# Patient Record
Sex: Female | Born: 1945 | Race: White | Hispanic: No | Marital: Married | Smoking: Never smoker
Health system: Southern US, Community
[De-identification: ages and names within clinical notes are randomized; demographics above are authoritative.]

## PROBLEM LIST (undated history)

## (undated) DIAGNOSIS — C801 Malignant (primary) neoplasm, unspecified: Secondary | ICD-10-CM

## (undated) DIAGNOSIS — E785 Hyperlipidemia, unspecified: Secondary | ICD-10-CM

## (undated) DIAGNOSIS — I10 Essential (primary) hypertension: Secondary | ICD-10-CM

## (undated) DIAGNOSIS — M8580 Other specified disorders of bone density and structure, unspecified site: Secondary | ICD-10-CM

## (undated) DIAGNOSIS — F419 Anxiety disorder, unspecified: Secondary | ICD-10-CM

## (undated) HISTORY — DX: Malignant (primary) neoplasm, unspecified: C80.1

## (undated) HISTORY — DX: Anxiety disorder, unspecified: F41.9

## (undated) HISTORY — DX: Hyperlipidemia, unspecified: E78.5

## (undated) HISTORY — PX: OTHER SURGICAL HISTORY: SHX169

## (undated) HISTORY — DX: Other specified disorders of bone density and structure, unspecified site: M85.80

## (undated) HISTORY — DX: Essential (primary) hypertension: I10

## (undated) HISTORY — PX: OVARIAN CYST REMOVAL: SHX89

---

## 1998-08-25 ENCOUNTER — Ambulatory Visit (HOSPITAL_COMMUNITY): Admission: RE | Admit: 1998-08-25 | Discharge: 1998-08-25 | Payer: Self-pay | Admitting: Gastroenterology

## 1999-02-07 ENCOUNTER — Ambulatory Visit (HOSPITAL_COMMUNITY): Admission: RE | Admit: 1999-02-07 | Discharge: 1999-02-07 | Payer: Self-pay

## 1999-10-16 ENCOUNTER — Other Ambulatory Visit: Admission: RE | Admit: 1999-10-16 | Discharge: 1999-10-16 | Payer: Self-pay | Admitting: Gynecology

## 2000-02-08 ENCOUNTER — Encounter: Admission: RE | Admit: 2000-02-08 | Discharge: 2000-02-08 | Payer: Self-pay | Admitting: Gynecology

## 2000-02-08 ENCOUNTER — Encounter: Payer: Self-pay | Admitting: Gynecology

## 2001-02-06 ENCOUNTER — Other Ambulatory Visit: Admission: RE | Admit: 2001-02-06 | Discharge: 2001-02-06 | Payer: Self-pay | Admitting: Gynecology

## 2001-03-23 ENCOUNTER — Encounter: Payer: Self-pay | Admitting: Gynecology

## 2001-03-23 ENCOUNTER — Encounter: Admission: RE | Admit: 2001-03-23 | Discharge: 2001-03-23 | Payer: Self-pay | Admitting: Gynecology

## 2002-02-17 ENCOUNTER — Other Ambulatory Visit: Admission: RE | Admit: 2002-02-17 | Discharge: 2002-02-17 | Payer: Self-pay | Admitting: Gynecology

## 2002-03-02 ENCOUNTER — Encounter: Admission: RE | Admit: 2002-03-02 | Discharge: 2002-03-02 | Payer: Self-pay | Admitting: Gynecology

## 2002-03-02 ENCOUNTER — Encounter: Payer: Self-pay | Admitting: Gynecology

## 2002-09-13 ENCOUNTER — Encounter: Payer: Self-pay | Admitting: Gynecology

## 2002-09-13 ENCOUNTER — Encounter: Admission: RE | Admit: 2002-09-13 | Discharge: 2002-09-13 | Payer: Self-pay | Admitting: Gynecology

## 2003-07-19 ENCOUNTER — Other Ambulatory Visit: Admission: RE | Admit: 2003-07-19 | Discharge: 2003-07-19 | Payer: Self-pay | Admitting: Gynecology

## 2003-09-13 ENCOUNTER — Emergency Department (HOSPITAL_COMMUNITY): Admission: EM | Admit: 2003-09-13 | Discharge: 2003-09-14 | Payer: Self-pay | Admitting: Emergency Medicine

## 2003-09-14 ENCOUNTER — Ambulatory Visit (HOSPITAL_COMMUNITY): Admission: RE | Admit: 2003-09-14 | Discharge: 2003-09-14 | Payer: Self-pay | Admitting: Gastroenterology

## 2004-06-21 ENCOUNTER — Encounter: Admission: RE | Admit: 2004-06-21 | Discharge: 2004-06-21 | Payer: Self-pay | Admitting: Gynecology

## 2004-07-25 ENCOUNTER — Other Ambulatory Visit: Admission: RE | Admit: 2004-07-25 | Discharge: 2004-07-25 | Payer: Self-pay | Admitting: Gynecology

## 2005-09-25 ENCOUNTER — Encounter: Admission: RE | Admit: 2005-09-25 | Discharge: 2005-09-25 | Payer: Self-pay | Admitting: Gynecology

## 2006-03-31 ENCOUNTER — Other Ambulatory Visit: Admission: RE | Admit: 2006-03-31 | Discharge: 2006-03-31 | Payer: Self-pay | Admitting: Gynecology

## 2011-11-08 DIAGNOSIS — R5381 Other malaise: Secondary | ICD-10-CM | POA: Diagnosis not present

## 2011-11-08 DIAGNOSIS — E7800 Pure hypercholesterolemia, unspecified: Secondary | ICD-10-CM | POA: Diagnosis not present

## 2011-11-08 DIAGNOSIS — E039 Hypothyroidism, unspecified: Secondary | ICD-10-CM | POA: Diagnosis not present

## 2011-11-08 DIAGNOSIS — E559 Vitamin D deficiency, unspecified: Secondary | ICD-10-CM | POA: Diagnosis not present

## 2011-11-13 DIAGNOSIS — H2510 Age-related nuclear cataract, unspecified eye: Secondary | ICD-10-CM | POA: Diagnosis not present

## 2011-11-20 DIAGNOSIS — L821 Other seborrheic keratosis: Secondary | ICD-10-CM | POA: Diagnosis not present

## 2011-11-20 DIAGNOSIS — K130 Diseases of lips: Secondary | ICD-10-CM | POA: Diagnosis not present

## 2011-11-20 DIAGNOSIS — L570 Actinic keratosis: Secondary | ICD-10-CM | POA: Diagnosis not present

## 2011-12-31 DIAGNOSIS — N8184 Pelvic muscle wasting: Secondary | ICD-10-CM | POA: Diagnosis not present

## 2011-12-31 DIAGNOSIS — E7800 Pure hypercholesterolemia, unspecified: Secondary | ICD-10-CM | POA: Diagnosis not present

## 2011-12-31 DIAGNOSIS — I10 Essential (primary) hypertension: Secondary | ICD-10-CM | POA: Diagnosis not present

## 2011-12-31 DIAGNOSIS — Z01419 Encounter for gynecological examination (general) (routine) without abnormal findings: Secondary | ICD-10-CM | POA: Diagnosis not present

## 2012-01-20 DIAGNOSIS — A630 Anogenital (venereal) warts: Secondary | ICD-10-CM | POA: Diagnosis not present

## 2012-01-20 DIAGNOSIS — D4959 Neoplasm of unspecified behavior of other genitourinary organ: Secondary | ICD-10-CM | POA: Diagnosis not present

## 2012-04-23 DIAGNOSIS — G4700 Insomnia, unspecified: Secondary | ICD-10-CM | POA: Diagnosis not present

## 2012-04-23 DIAGNOSIS — J019 Acute sinusitis, unspecified: Secondary | ICD-10-CM | POA: Diagnosis not present

## 2012-04-23 DIAGNOSIS — J209 Acute bronchitis, unspecified: Secondary | ICD-10-CM | POA: Diagnosis not present

## 2012-04-29 DIAGNOSIS — J410 Simple chronic bronchitis: Secondary | ICD-10-CM | POA: Diagnosis not present

## 2012-12-08 DIAGNOSIS — E039 Hypothyroidism, unspecified: Secondary | ICD-10-CM | POA: Diagnosis not present

## 2012-12-08 DIAGNOSIS — I10 Essential (primary) hypertension: Secondary | ICD-10-CM | POA: Diagnosis not present

## 2012-12-08 DIAGNOSIS — F411 Generalized anxiety disorder: Secondary | ICD-10-CM | POA: Diagnosis not present

## 2012-12-08 DIAGNOSIS — E782 Mixed hyperlipidemia: Secondary | ICD-10-CM | POA: Diagnosis not present

## 2012-12-08 DIAGNOSIS — F329 Major depressive disorder, single episode, unspecified: Secondary | ICD-10-CM | POA: Diagnosis not present

## 2012-12-08 DIAGNOSIS — Z79899 Other long term (current) drug therapy: Secondary | ICD-10-CM | POA: Diagnosis not present

## 2012-12-08 DIAGNOSIS — E559 Vitamin D deficiency, unspecified: Secondary | ICD-10-CM | POA: Diagnosis not present

## 2012-12-08 DIAGNOSIS — E119 Type 2 diabetes mellitus without complications: Secondary | ICD-10-CM | POA: Diagnosis not present

## 2012-12-09 DIAGNOSIS — H2510 Age-related nuclear cataract, unspecified eye: Secondary | ICD-10-CM | POA: Diagnosis not present

## 2013-05-11 DIAGNOSIS — G4700 Insomnia, unspecified: Secondary | ICD-10-CM | POA: Diagnosis not present

## 2013-05-11 DIAGNOSIS — F411 Generalized anxiety disorder: Secondary | ICD-10-CM | POA: Diagnosis not present

## 2013-05-11 DIAGNOSIS — I10 Essential (primary) hypertension: Secondary | ICD-10-CM | POA: Diagnosis not present

## 2013-05-11 DIAGNOSIS — F329 Major depressive disorder, single episode, unspecified: Secondary | ICD-10-CM | POA: Diagnosis not present

## 2013-07-09 DIAGNOSIS — Z85828 Personal history of other malignant neoplasm of skin: Secondary | ICD-10-CM | POA: Diagnosis not present

## 2013-07-09 DIAGNOSIS — L570 Actinic keratosis: Secondary | ICD-10-CM | POA: Diagnosis not present

## 2013-07-09 DIAGNOSIS — D485 Neoplasm of uncertain behavior of skin: Secondary | ICD-10-CM | POA: Diagnosis not present

## 2013-07-09 DIAGNOSIS — L820 Inflamed seborrheic keratosis: Secondary | ICD-10-CM | POA: Diagnosis not present

## 2013-07-09 DIAGNOSIS — L578 Other skin changes due to chronic exposure to nonionizing radiation: Secondary | ICD-10-CM | POA: Diagnosis not present

## 2013-08-11 DIAGNOSIS — H6120 Impacted cerumen, unspecified ear: Secondary | ICD-10-CM | POA: Diagnosis not present

## 2013-08-18 ENCOUNTER — Encounter: Payer: Self-pay | Admitting: Gynecology

## 2013-08-18 ENCOUNTER — Telehealth: Payer: Self-pay | Admitting: Gynecology

## 2013-08-18 ENCOUNTER — Ambulatory Visit (INDEPENDENT_AMBULATORY_CARE_PROVIDER_SITE_OTHER): Payer: Medicare Other | Admitting: Gynecology

## 2013-08-18 ENCOUNTER — Other Ambulatory Visit (HOSPITAL_COMMUNITY)
Admission: RE | Admit: 2013-08-18 | Discharge: 2013-08-18 | Disposition: A | Discharge status: Home / Self Care | Payer: Medicare Other | Source: Ambulatory Visit | Attending: Gynecology | Admitting: Gynecology

## 2013-08-18 VITALS — BP 124/78 | Ht 67.0 in | Wt 157.0 lb

## 2013-08-18 DIAGNOSIS — I10 Essential (primary) hypertension: Secondary | ICD-10-CM

## 2013-08-18 DIAGNOSIS — N952 Postmenopausal atrophic vaginitis: Secondary | ICD-10-CM

## 2013-08-18 DIAGNOSIS — Z124 Encounter for screening for malignant neoplasm of cervix: Secondary | ICD-10-CM

## 2013-08-18 DIAGNOSIS — F411 Generalized anxiety disorder: Secondary | ICD-10-CM | POA: Diagnosis not present

## 2013-08-18 MED ORDER — METOPROLOL SUCCINATE ER 25 MG PO TB24: 25.0000 mg | ORAL_TABLET | Freq: Every day | ORAL | Status: DC

## 2013-08-18 MED ORDER — ALPRAZOLAM 0.5 MG PO TABS: 0.5000 mg | ORAL_TABLET | Freq: Two times a day (BID) | ORAL | Status: DC | PRN

## 2013-08-18 NOTE — Telephone Encounter (Signed)
Tell patient her last bone density did show osteopenia and I recommend a repeat bone density scheduled here at her convenience.

## 2013-08-18 NOTE — Progress Notes (Signed)
Cheryl Myers 29-Jan-1946 [id number]        68 y.o.  G2P2002 new patient  for  followup  exam.  Former patient of Dr. Ubaldo Glassing. Several issues noted below.  Past medical history,surgical history, problem list, medications, allergies, family history and social history were all reviewed and documented in the EPIC chart.  ROS:  Performed and pertinent positives and negatives are included in the history, assessment and plan .  Exam: [name]  assistant Filed Vitals:   08/18/13 1102  BP: 124/78  Height: 5\' 7"  (1.702 m)  Weight: 157 lb (71.215 kg)   General appearance  Normal Skin grossly normal Head/Neck normal with no cervical or supraclavicular adenopathy thyroid normal Lungs  clear Cardiac RR, without RMG Abdominal  soft, nontender, without masses, organomegaly or hernia Breasts  examined lying and sitting without masses, retractions, discharge or axillary adenopathy. Pelvic  Ext/BUS/vagina with generalized atrophic changes  Cervix with atrophic changes. Pap done  Uterus  anteverted,  normal size, shape and contour, midline and mobile nontender   Adnexa  Without masses or tenderness    Anus and perineum  Normal   Rectovaginal  Normal sphincter tone without palpated masses or tenderness.    Assessment/Plan:  68 y.o. [id number] new patient.   1. Postmenopausal/atrophic genital changes. Patient without significant symptoms of hot flushes, night sweats, vaginal dryness or dyspareunia. No vaginal bleeding. Will continue to monitor. Patient knows to report any vaginal bleeding. 2. Anxiety. Patient has a history of anxiety for a number of years. Started on Zoloft by Dr. Ubaldo Glassing and Xanax when necessary. Went through death of her mother with Alzheimer's and issues with her children. She admits to not taking consistently. Recommended that she stop her Zoloft and to use Xanax when necessary as she relates using it only when she feels anxious. Xanax 0.5 mg #30 with 4 refills provided. Patient will call me  she has an issue with not continuing the Zoloft. Again she has been off of this for some time and does not need to taper at this point. 3. Hypertension. Blood pressure 124/78. Being treated by Dr. Ubaldo Glassing with metoprolol for a number of years. Recommended that patient establish care with a primary physician/internist to continue to follow her hypertension and for her general medical health given that she is 40. Patient agrees to establish care either here or [place]. I refilled her metoprolol tartrate 2 months to allow her time to get in to see somebody. 4. Bone density reported normal 2012. We'll obtain a copy of this and then triaged based upon these results. Extra calcium and vitamin D recommendations reviewed. 5. Pap smear 2013. I have no records of her previous Pap smears but she denies any abnormalities in the past. Pap smear done today. 6. Mammography 2013. Patient knows that she needs to schedule now and annually. SBE monthly reviewed. 7. Colonoscopy 8 years ago. Recommended repeat interval per patient's history is 10 years. 8. Health maintenance. No blood work done and she will have this done when she establishes care for her primary physician's office. Followup one year, sooner as needed.   Note: This document was prepared with digital dictation and possible smart phrase technology. Any transcriptional errors that result from this process are unintentional.   Anastasio Auerbach MD, 11:48 AM 08/18/2013

## 2013-08-18 NOTE — Patient Instructions (Signed)
Make an appointment to see a primary care physician in followup of your hypertension and to be able to do routine blood work. Followup with me in one year for ongoing gynecologic care.

## 2013-08-18 NOTE — Addendum Note (Signed)
Addended by: Nelva Nay on: 08/18/2013 12:03 PM   Modules accepted: Orders

## 2013-08-19 LAB — URINALYSIS W MICROSCOPIC + REFLEX CULTURE
BACTERIA UA: NONE SEEN
BILIRUBIN URINE: NEGATIVE
CASTS: NONE SEEN
Crystals: NONE SEEN
Glucose, UA: NEGATIVE mg/dL
HGB URINE DIPSTICK: NEGATIVE
KETONES UR: NEGATIVE mg/dL
LEUKOCYTES UA: NEGATIVE
NITRITE: NEGATIVE
PH: 5 (ref 5.0–8.0)
PROTEIN: NEGATIVE mg/dL
Specific Gravity, Urine: <1.005 — ABNORMAL LOW (ref 1.005–1.030)
Squamous Epithelial / LPF: NONE SEEN
Urobilinogen, UA: 0.2 mg/dL (ref 0.0–1.0)

## 2013-08-19 NOTE — Telephone Encounter (Signed)
Talked with patient and informed her of Dr Zelphia Cairo note and scheduled Bone Density for October 05, 2013.

## 2013-08-25 ENCOUNTER — Other Ambulatory Visit: Payer: Self-pay | Admitting: Gynecology

## 2013-08-25 DIAGNOSIS — M8580 Other specified disorders of bone density and structure, unspecified site: Secondary | ICD-10-CM

## 2013-09-24 DIAGNOSIS — D045 Carcinoma in situ of skin of trunk: Secondary | ICD-10-CM | POA: Diagnosis not present

## 2013-11-10 ENCOUNTER — Encounter: Payer: Self-pay | Admitting: Gynecology

## 2013-11-10 ENCOUNTER — Ambulatory Visit (INDEPENDENT_AMBULATORY_CARE_PROVIDER_SITE_OTHER): Payer: Medicare Other | Admitting: Gynecology

## 2013-11-10 ENCOUNTER — Other Ambulatory Visit: Payer: Self-pay | Admitting: Gynecology

## 2013-11-10 DIAGNOSIS — F411 Generalized anxiety disorder: Secondary | ICD-10-CM | POA: Diagnosis not present

## 2013-11-10 DIAGNOSIS — R7989 Other specified abnormal findings of blood chemistry: Secondary | ICD-10-CM | POA: Diagnosis not present

## 2013-11-10 DIAGNOSIS — R82998 Other abnormal findings in urine: Secondary | ICD-10-CM | POA: Diagnosis not present

## 2013-11-10 MED ORDER — PAROXETINE HCL 20 MG PO TABS: 20.0000 mg | ORAL_TABLET | Freq: Every day | ORAL | Status: DC

## 2013-11-10 NOTE — Patient Instructions (Signed)
Start on Paxil 20 mg daily. Call me in one month in followup to see how you're doing. If you seem to be having worsening anxiety, depression or suicide thoughts you need to call as soon as possible.

## 2013-11-10 NOTE — Progress Notes (Signed)
Cheryl Myers 05-24-1946 [id number]        68 y.o.  G2P2002 presents complaining of worsening anxiety, crying spells and difficulty sleeping. Had been on Zoloft 100 mg and Xanax when necessary when I saw her in February. She is weaned off the Zoloft and her symptoms seem to be worsening. She is not having any suicide ideation or difficulty functioning on a daily basis. She is under a lot of stress caring for her daughter who has MS.  Past medical history,surgical history, problem list, medications, allergies, family history and social history were all reviewed and documented in the EPIC chart.  Directed ROS with pertinent positives and negatives documented in the history of present illness/assessment and plan.  Exam: Pulse regular at 72, respirations 14 General appearance  Normal without overt evidence of anxiety.  Assessment/Plan:  68 y.o. G2P2002 worsening anxiety since stopping Zoloft. She does note having withdrawal symptoms to include nausea and GI upset following her weaning. She finds herself taking Xanax #30 each month although not daily sometimes she takes 2 a day. Not having overt anxiety attacks or worsening depression. Discussed very his options and ultimately we decided on a trial of Paxil 20 mg daily. She is to call me in one month after initiation and followup and will see how she's doing and decide if a dose adjustments necessary. Worsening depression or suicide ideation ASAP call precautions reviewed. If her symptoms continue or worsen then will refer her to a mental health provider.   Note: This document was prepared with digital dictation and possible smart phrase technology. Any transcriptional errors that result from this process are unintentional.   Anastasio Auerbach MD, 3:17 PM 11/10/2013

## 2013-11-11 ENCOUNTER — Other Ambulatory Visit: Payer: Self-pay | Admitting: *Deleted

## 2013-11-11 ENCOUNTER — Encounter: Payer: Self-pay | Admitting: Gynecology

## 2013-11-11 DIAGNOSIS — R748 Abnormal levels of other serum enzymes: Secondary | ICD-10-CM

## 2013-11-11 DIAGNOSIS — R7309 Other abnormal glucose: Secondary | ICD-10-CM

## 2013-11-11 LAB — CBC WITH DIFFERENTIAL/PLATELET
BASOS PCT: 0 % (ref 0–1)
Basophils Absolute: 0 10*3/uL (ref 0.0–0.1)
EOS ABS: 0.2 10*3/uL (ref 0.0–0.7)
EOS PCT: 3 % (ref 0–5)
HEMATOCRIT: 35.8 % — AB (ref 36.0–46.0)
HEMOGLOBIN: 12.1 g/dL (ref 12.0–15.0)
LYMPHS ABS: 1.9 10*3/uL (ref 0.7–4.0)
LYMPHS PCT: 37 % (ref 12–46)
MCH: 30.3 pg (ref 26.0–34.0)
MCHC: 33.8 g/dL (ref 30.0–36.0)
MCV: 89.7 fL (ref 78.0–100.0)
MONO ABS: 0.4 10*3/uL (ref 0.1–1.0)
MONOS PCT: 8 % (ref 3–12)
NEUTROS ABS: 2.7 10*3/uL (ref 1.7–7.7)
Neutrophils Relative %: 52 % (ref 43–77)
Platelets: 251 10*3/uL (ref 150–400)
RBC: 3.99 MIL/uL (ref 3.87–5.11)
RDW: 13.2 % (ref 11.5–15.5)
WBC: 5.1 10*3/uL (ref 4.0–10.5)

## 2013-11-11 LAB — COMPREHENSIVE METABOLIC PANEL
ALK PHOS: 74 U/L (ref 39–117)
ALT: 17 U/L (ref 0–35)
AST: 15 U/L (ref 0–37)
Albumin: 4.4 g/dL (ref 3.5–5.2)
BUN: 16 mg/dL (ref 6–23)
CHLORIDE: 103 meq/L (ref 96–112)
CO2: 26 meq/L (ref 19–32)
CREATININE: 1.22 mg/dL — AB (ref 0.50–1.10)
Calcium: 9.9 mg/dL (ref 8.4–10.5)
GLUCOSE: 103 mg/dL — AB (ref 70–99)
Potassium: 3.9 mEq/L (ref 3.5–5.3)
SODIUM: 140 meq/L (ref 135–145)
TOTAL PROTEIN: 6.8 g/dL (ref 6.0–8.3)
Total Bilirubin: 0.4 mg/dL (ref 0.2–1.2)

## 2013-11-11 LAB — TSH: TSH: 2.138 u[IU]/mL (ref 0.350–4.500)

## 2013-11-12 ENCOUNTER — Other Ambulatory Visit: Payer: Medicare Other

## 2013-11-12 DIAGNOSIS — R7309 Other abnormal glucose: Secondary | ICD-10-CM

## 2013-11-12 DIAGNOSIS — R748 Abnormal levels of other serum enzymes: Secondary | ICD-10-CM | POA: Diagnosis not present

## 2013-11-13 LAB — COMPREHENSIVE METABOLIC PANEL
ALBUMIN: 4.3 g/dL (ref 3.5–5.2)
ALK PHOS: 69 U/L (ref 39–117)
ALT: 15 U/L (ref 0–35)
AST: 15 U/L (ref 0–37)
BILIRUBIN TOTAL: 0.6 mg/dL (ref 0.2–1.2)
BUN: 20 mg/dL (ref 6–23)
CO2: 29 meq/L (ref 19–32)
CREATININE: 0.72 mg/dL (ref 0.50–1.10)
Calcium: 10 mg/dL (ref 8.4–10.5)
Chloride: 104 mEq/L (ref 96–112)
GLUCOSE: 96 mg/dL (ref 70–99)
Potassium: 4.9 mEq/L (ref 3.5–5.3)
Sodium: 139 mEq/L (ref 135–145)
Total Protein: 6.7 g/dL (ref 6.0–8.3)

## 2013-12-06 ENCOUNTER — Other Ambulatory Visit: Payer: Self-pay | Admitting: Gynecology

## 2013-12-07 ENCOUNTER — Telehealth: Payer: Self-pay | Admitting: *Deleted

## 2013-12-07 NOTE — Telephone Encounter (Signed)
Pt called requesting increase Paxil 20 mg, pt feels the need for increase still having some anxiety. Please advise

## 2013-12-08 NOTE — Telephone Encounter (Signed)
I did confirm patient is currently on 20 mg.  She immediately started the conversation by saying she was just going to stay on that. I reminded her she called yesterday stating she felt need for increase in does and still having anxiety.  She said yes she did but she had decided she wants to "perservere" and stay on the 20mg .  She said she will schedule appointment in a few weeks if not better. She insisted on remaining on current dose.

## 2013-12-08 NOTE — Telephone Encounter (Signed)
I thought she was on Paxil 20 mg. If she is and does not feel she's getting a good response and increase her to Paxil 30 mg #30 with one refill. Call back after one to 2 months to let us know how she is doing to obtain more refills

## 2013-12-11 DIAGNOSIS — R300 Dysuria: Secondary | ICD-10-CM | POA: Diagnosis not present

## 2013-12-15 ENCOUNTER — Encounter: Payer: Self-pay | Admitting: Women's Health

## 2013-12-15 ENCOUNTER — Ambulatory Visit (INDEPENDENT_AMBULATORY_CARE_PROVIDER_SITE_OTHER): Payer: Medicare Other | Admitting: Women's Health

## 2013-12-15 DIAGNOSIS — R300 Dysuria: Secondary | ICD-10-CM | POA: Diagnosis not present

## 2013-12-15 LAB — URINALYSIS W MICROSCOPIC + REFLEX CULTURE
BILIRUBIN URINE: NEGATIVE
GLUCOSE, UA: NEGATIVE mg/dL
HGB URINE DIPSTICK: NEGATIVE
KETONES UR: NEGATIVE mg/dL
Leukocytes, UA: NEGATIVE
Nitrite: NEGATIVE
PH: 5 (ref 5.0–8.0)
Protein, ur: NEGATIVE mg/dL
Specific Gravity, Urine: <1.005 — ABNORMAL LOW (ref 1.005–1.030)
Urobilinogen, UA: 0.2 mg/dL (ref 0.0–1.0)

## 2013-12-15 LAB — WET PREP FOR TRICH, YEAST, CLUE
Clue Cells Wet Prep HPF POC: NONE SEEN
Trich, Wet Prep: NONE SEEN
WBC, Wet Prep HPF POC: NONE SEEN
YEAST WET PREP: NONE SEEN

## 2013-12-15 NOTE — Progress Notes (Signed)
Patient ID: Cheryl Myers, female   DOB: 09-11-1945, 68 y.o.   MRN: [id number]  Presents with complaint of urinary frequency, urgency, and feeling of incomplete emptying x 6 days. Went to urgent care on 12/11/13 negative UA but was treated clinically for UTI with cipro 500mg  BID x 3 days. Symptoms improved with antibiotics but have not completely cleared. Denies fever/chills, hematuria, vaginal discharge or spotting.  Postmenopausal/no HRT.  Exam: appears well External: erythema at introitus Speculum: atrophic, no discharge Wet Prep: negative UA: negative  Persistent dysuria with pressure  Plan: Conservative management. Reassurance given, increased fluid intake recommended. Instructed to call if symptoms worsen or do not improve.

## 2013-12-22 DIAGNOSIS — H2510 Age-related nuclear cataract, unspecified eye: Secondary | ICD-10-CM | POA: Diagnosis not present

## 2014-01-11 ENCOUNTER — Other Ambulatory Visit: Payer: Self-pay | Admitting: Gynecology

## 2014-01-11 DIAGNOSIS — L570 Actinic keratosis: Secondary | ICD-10-CM | POA: Diagnosis not present

## 2014-01-11 DIAGNOSIS — L578 Other skin changes due to chronic exposure to nonionizing radiation: Secondary | ICD-10-CM | POA: Diagnosis not present

## 2014-01-11 DIAGNOSIS — Z85828 Personal history of other malignant neoplasm of skin: Secondary | ICD-10-CM | POA: Diagnosis not present

## 2014-01-11 NOTE — Telephone Encounter (Signed)
Called into pharmacy

## 2014-02-22 DIAGNOSIS — J0100 Acute maxillary sinusitis, unspecified: Secondary | ICD-10-CM | POA: Diagnosis not present

## 2014-05-02 ENCOUNTER — Encounter: Payer: Self-pay | Admitting: Women's Health

## 2014-06-02 DIAGNOSIS — L820 Inflamed seborrheic keratosis: Secondary | ICD-10-CM | POA: Diagnosis not present

## 2014-06-02 DIAGNOSIS — D485 Neoplasm of uncertain behavior of skin: Secondary | ICD-10-CM | POA: Diagnosis not present

## 2014-06-05 DIAGNOSIS — J069 Acute upper respiratory infection, unspecified: Secondary | ICD-10-CM | POA: Diagnosis not present

## 2014-07-05 DIAGNOSIS — Z1231 Encounter for screening mammogram for malignant neoplasm of breast: Secondary | ICD-10-CM | POA: Diagnosis not present

## 2014-07-06 ENCOUNTER — Encounter: Payer: Self-pay | Admitting: Gynecology

## 2014-07-19 DIAGNOSIS — L820 Inflamed seborrheic keratosis: Secondary | ICD-10-CM | POA: Diagnosis not present

## 2014-07-19 DIAGNOSIS — L570 Actinic keratosis: Secondary | ICD-10-CM | POA: Diagnosis not present

## 2014-07-19 DIAGNOSIS — L578 Other skin changes due to chronic exposure to nonionizing radiation: Secondary | ICD-10-CM | POA: Diagnosis not present

## 2014-08-12 ENCOUNTER — Other Ambulatory Visit: Payer: Self-pay | Admitting: Gynecology

## 2014-08-26 ENCOUNTER — Encounter: Payer: Self-pay | Admitting: Gynecology

## 2014-08-26 ENCOUNTER — Ambulatory Visit (INDEPENDENT_AMBULATORY_CARE_PROVIDER_SITE_OTHER): Payer: Medicare Other | Admitting: Gynecology

## 2014-08-26 VITALS — BP 134/86 | Ht 67.0 in | Wt 164.0 lb

## 2014-08-26 DIAGNOSIS — N952 Postmenopausal atrophic vaginitis: Secondary | ICD-10-CM | POA: Diagnosis not present

## 2014-08-26 DIAGNOSIS — Z01419 Encounter for gynecological examination (general) (routine) without abnormal findings: Secondary | ICD-10-CM | POA: Diagnosis not present

## 2014-08-26 DIAGNOSIS — M8580 Other specified disorders of bone density and structure, unspecified site: Secondary | ICD-10-CM | POA: Diagnosis not present

## 2014-08-26 MED ORDER — PAROXETINE HCL 20 MG PO TABS: 20.0000 mg | ORAL_TABLET | Freq: Every day | ORAL | Status: DC

## 2014-08-26 MED ORDER — ALPRAZOLAM 0.5 MG PO TABS: 0.5000 mg | ORAL_TABLET | Freq: Two times a day (BID) | ORAL | Status: DC | PRN

## 2014-08-26 NOTE — Patient Instructions (Signed)
Make an appointment for a general medical exam with [place] [facility]  Schedule your colonoscopy through [facility] gastroenterology.  You may obtain a copy of any labs that were done today by logging onto MyChart as outlined in the instructions provided with your AVS (after visit summary). The office will not call with normal lab results but certainly if there are any significant abnormalities then we will contact you.   Health Maintenance, Female A healthy lifestyle and preventative care can promote health and wellness.  Maintain regular health, dental, and eye exams.  Eat a healthy diet. Foods like vegetables, fruits, whole grains, low-fat dairy products, and lean protein foods contain the nutrients you need without too many calories. Decrease your intake of foods high in solid fats, added sugars, and salt. Get information about a proper diet from your caregiver, if necessary.  Regular physical exercise is one of the most important things you can do for your health. Most adults should get at least 150 minutes of moderate-intensity exercise (any activity that increases your heart rate and causes you to sweat) each week. In addition, most adults need muscle-strengthening exercises on 2 or more days a week.   Maintain a healthy weight. The body mass index (BMI) is a screening tool to identify possible weight problems. It provides an estimate of body fat based on height and weight. Your caregiver can help determine your BMI, and can help you achieve or maintain a healthy weight. For adults 20 years and older:  A BMI below 18.5 is considered underweight.  A BMI of 18.5 to 24.9 is normal.  A BMI of 25 to 29.9 is considered overweight.  A BMI of 30 and above is considered obese.  Maintain normal blood lipids and cholesterol by exercising and minimizing your intake of saturated fat. Eat a balanced diet with plenty of fruits and vegetables. Blood tests for lipids and cholesterol should begin at age  75 and be repeated every 5 years. If your lipid or cholesterol levels are high, you are over 50, or you are a high risk for heart disease, you may need your cholesterol levels checked more frequently.Ongoing high lipid and cholesterol levels should be treated with medicines if diet and exercise are not effective.  If you smoke, find out from your caregiver how to quit. If you do not use tobacco, do not start.  Lung cancer screening is recommended for adults aged 84 80 years who are at high risk for developing lung cancer because of a history of smoking. Yearly low-dose computed tomography (CT) is recommended for people who have at least a 30-pack-year history of smoking and are a current smoker or have quit within the past 15 years. A pack year of smoking is smoking an average of 1 pack of cigarettes a day for 1 year (for example: 1 pack a day for 30 years or 2 packs a day for 15 years). Yearly screening should continue until the smoker has stopped smoking for at least 15 years. Yearly screening should also be stopped for people who develop a health problem that would prevent them from having lung cancer treatment.  If you are pregnant, do not drink alcohol. If you are breastfeeding, be very cautious about drinking alcohol. If you are not pregnant and choose to drink alcohol, do not exceed 1 drink per day. One drink is considered to be 12 ounces (355 mL) of beer, 5 ounces (148 mL) of wine, or 1.5 ounces (44 mL) of liquor.  Avoid use of  street drugs. Do not share needles with anyone. Ask for help if you need support or instructions about stopping the use of drugs.  High blood pressure causes heart disease and increases the risk of stroke. Blood pressure should be checked at least every 1 to 2 years. Ongoing high blood pressure should be treated with medicines, if weight loss and exercise are not effective.  If you are 51 to 68 years old, ask your caregiver if you should take aspirin to prevent  strokes.  Diabetes screening involves taking a blood sample to check your fasting blood sugar level. This should be done once every 3 years, after age 82, if you are within normal weight and without risk factors for diabetes. Testing should be considered at a younger age or be carried out more frequently if you are overweight and have at least 1 risk factor for diabetes.  Breast cancer screening is essential preventative care for women. You should practice "breast self-awareness." This means understanding the normal appearance and feel of your breasts and may include breast self-examination. Any changes detected, no matter how small, should be reported to a caregiver. Women in their 110s and 30s should have a clinical breast exam (CBE) by a caregiver as part of a regular health exam every 1 to 3 years. After age 98, women should have a CBE every year. Starting at age 102, women should consider having a mammogram (breast X-ray) every year. Women who have a family history of breast cancer should talk to their caregiver about genetic screening. Women at a high risk of breast cancer should talk to their caregiver about having an MRI and a mammogram every year.  Breast cancer gene (BRCA)-related cancer risk assessment is recommended for women who have family members with BRCA-related cancers. BRCA-related cancers include breast, ovarian, tubal, and peritoneal cancers. Having family members with these cancers may be associated with an increased risk for harmful changes (mutations) in the breast cancer genes BRCA1 and BRCA2. Results of the assessment will determine the need for genetic counseling and BRCA1 and BRCA2 testing.  The Pap test is a screening test for cervical cancer. Women should have a Pap test starting at age 43. Between ages 14 and 79, Pap tests should be repeated every 2 years. Beginning at age 27, you should have a Pap test every 3 years as long as the past 3 Pap tests have been normal. If you had a  hysterectomy for a problem that was not cancer or a condition that could lead to cancer, then you no longer need Pap tests. If you are between ages 30 and 64, and you have had normal Pap tests going back 10 years, you no longer need Pap tests. If you have had past treatment for cervical cancer or a condition that could lead to cancer, you need Pap tests and screening for cancer for at least 20 years after your treatment. If Pap tests have been discontinued, risk factors (such as a new sexual partner) need to be reassessed to determine if screening should be resumed. Some women have medical problems that increase the chance of getting cervical cancer. In these cases, your caregiver may recommend more frequent screening and Pap tests.  The human papillomavirus (HPV) test is an additional test that may be used for cervical cancer screening. The HPV test looks for the virus that can cause the cell changes on the cervix. The cells collected during the Pap test can be tested for HPV. The HPV test could  be used to screen women aged 41 years and older, and should be used in women of any age who have unclear Pap test results. After the age of 1, women should have HPV testing at the same frequency as a Pap test.  Colorectal cancer can be detected and often prevented. Most routine colorectal cancer screening begins at the age of 65 and continues through age 31. However, your caregiver may recommend screening at an earlier age if you have risk factors for colon cancer. On a yearly basis, your caregiver may provide home test kits to check for hidden blood in the stool. Use of a small camera at the end of a tube, to directly examine the colon (sigmoidoscopy or colonoscopy), can detect the earliest forms of colorectal cancer. Talk to your caregiver about this at age 62, when routine screening begins. Direct examination of the colon should be repeated every 5 to 10 years through age 32, unless early forms of pre-cancerous  polyps or small growths are found.  Hepatitis C blood testing is recommended for all people born from [birth year] through 1964 and any individual with known risks for hepatitis C.  Practice safe sex. Use condoms and avoid high-risk sexual practices to reduce the spread of sexually transmitted infections (STIs). Sexually active women aged 19 and younger should be checked for Chlamydia, which is a common sexually transmitted infection. Older women with new or multiple partners should also be tested for Chlamydia. Testing for other STIs is recommended if you are sexually active and at increased risk.  Osteoporosis is a disease in which the bones lose minerals and strength with aging. This can result in serious bone fractures. The risk of osteoporosis can be identified using a bone density scan. Women ages 96 and over and women at risk for fractures or osteoporosis should discuss screening with their caregivers. Ask your caregiver whether you should be taking a calcium supplement or vitamin D to reduce the rate of osteoporosis.  Menopause can be associated with physical symptoms and risks. Hormone replacement therapy is available to decrease symptoms and risks. You should talk to your caregiver about whether hormone replacement therapy is right for you.  Use sunscreen. Apply sunscreen liberally and repeatedly throughout the day. You should seek shade when your shadow is shorter than you. Protect yourself by wearing long sleeves, pants, a wide-brimmed hat, and sunglasses year round, whenever you are outdoors.  Notify your caregiver of new moles or changes in moles, especially if there is a change in shape or color. Also notify your caregiver if a mole is larger than the size of a pencil eraser.  Stay current with your immunizations. Document Released: 12/31/2010 Document Revised: 10/12/2012 Document Reviewed: 12/31/2010 [facility] Patient Information 2014 [place].

## 2014-08-26 NOTE — Progress Notes (Signed)
Cheryl Myers [birth year] [id number]        69 y.o.  G2P2002 for breast and pelvic exam. Several issues noted below.  Past medical history,surgical history, problem list, medications, allergies, family history and social history were all reviewed and documented as reviewed in the EPIC chart.  ROS:  Performed with pertinent positives and negatives included in the history, assessment and plan.   Additional significant findings :  none   Exam: [name] Counsellor Vitals:   08/26/14 1402  BP: 134/86  Height: 5\' 7"  (1.702 m)  Weight: 164 lb (74.39 kg)   General appearance:  Normal affect, orientation and appearance. Skin: Grossly normal HEENT: Without gross lesions.  No cervical or supraclavicular adenopathy. Thyroid normal.  Lungs:  Clear without wheezing, rales or rhonchi Cardiac: RR, without RMG Abdominal:  Soft, nontender, without masses, guarding, rebound, organomegaly or hernia Breasts:  Examined lying and sitting without masses, retractions, discharge or axillary adenopathy. Pelvic:  Ext/BUS/vagina with generalized atrophic changes  Cervix with atrophic changes  Uterus anteverted, normal size, shape and contour, midline and mobile nontender   Adnexa  Without masses or tenderness    Anus and perineum  Normal   Rectovaginal  Normal sphincter tone without palpated masses or tenderness.    Assessment/Plan:  69 y.o. [id number] female for breast and pelvic exam.   1. Postmenopausal/atrophic genital changes. Doing well without significant hot flashes, night sweats, vaginal dryness or any vaginal bleeding. Continue to monitor and report any vaginal bleeding. 2. Pap smear 08/2013. No Pap smear done today.  No history of significant abnormal Pap smears. Options to stop screening altogether she is over the age of 57 versus less frequent screening intervals reviewed. Will readdress on annual basis. 3. Mammography 07/2014. Continue with annual mammography. SBE monthly reviewed. 4. Osteopenia.  DEXA 11/2009 with T score -1.8. No FRAX calculated. Recommend repeat DEXA now and patient agrees to schedule. Increased calcium vitamin D reviewed. 5. Colonoscopy overdue. Recommended patient schedule now.  Patient agrees to arrange through [facility] gastroenterology. 6. Anxiety.  Started on Paxil 20 mg this past year and has done well with this and wants to continue. Refill 1 year provided. Takes Xanax 0.5 mg when necessary when extremely stressed. Again is doing well with this without significant side effects.  #30 with 3 refills provided. 7. Health maintenance. Patient never made an appointment to see a primary physician as recommended last year. I again stressed the need at age 4 that she needs to see a primary physician for ongoing general medical health care. She does have borderline hypertension with blood pressure today 134/86. Needs follow up as far as cholesterol also. Patient agrees to schedule again I recommended [facility] and she agrees with this. No blood work done today she will have this done through their office. Follow up in one year, sooner as needed.     Anastasio Auerbach MD, 2:24 PM 08/26/2014

## 2014-08-27 LAB — URINALYSIS W MICROSCOPIC + REFLEX CULTURE
BACTERIA UA: NONE SEEN
Bilirubin Urine: NEGATIVE
CASTS: NONE SEEN
CRYSTALS: NONE SEEN
Glucose, UA: NEGATIVE mg/dL
Hgb urine dipstick: NEGATIVE
KETONES UR: NEGATIVE mg/dL
LEUKOCYTES UA: NEGATIVE
NITRITE: NEGATIVE
PH: 5 (ref 5.0–8.0)
PROTEIN: NEGATIVE mg/dL
SPECIFIC GRAVITY, URINE: 1.008 (ref 1.005–1.030)
Squamous Epithelial / LPF: NONE SEEN
Urobilinogen, UA: 0.2 mg/dL (ref 0.0–1.0)

## 2014-08-30 DIAGNOSIS — M8580 Other specified disorders of bone density and structure, unspecified site: Secondary | ICD-10-CM

## 2014-08-30 HISTORY — DX: Other specified disorders of bone density and structure, unspecified site: M85.80

## 2014-09-20 ENCOUNTER — Ambulatory Visit (INDEPENDENT_AMBULATORY_CARE_PROVIDER_SITE_OTHER): Payer: Medicare Other

## 2014-09-20 ENCOUNTER — Encounter: Payer: Self-pay | Admitting: Gynecology

## 2014-09-20 ENCOUNTER — Other Ambulatory Visit: Payer: Self-pay | Admitting: Gynecology

## 2014-09-20 DIAGNOSIS — M8580 Other specified disorders of bone density and structure, unspecified site: Secondary | ICD-10-CM

## 2014-09-20 DIAGNOSIS — Z13820 Encounter for screening for osteoporosis: Secondary | ICD-10-CM

## 2014-09-20 DIAGNOSIS — Z01419 Encounter for gynecological examination (general) (routine) without abnormal findings: Secondary | ICD-10-CM

## 2014-10-18 DIAGNOSIS — L570 Actinic keratosis: Secondary | ICD-10-CM | POA: Diagnosis not present

## 2015-01-20 ENCOUNTER — Other Ambulatory Visit: Payer: Self-pay | Admitting: Gynecology

## 2015-01-23 NOTE — Telephone Encounter (Signed)
Rx called in 

## 2015-01-25 DIAGNOSIS — H25813 Combined forms of age-related cataract, bilateral: Secondary | ICD-10-CM | POA: Diagnosis not present

## 2015-01-31 ENCOUNTER — Encounter: Payer: Self-pay | Admitting: Gynecology

## 2015-01-31 ENCOUNTER — Ambulatory Visit (INDEPENDENT_AMBULATORY_CARE_PROVIDER_SITE_OTHER): Payer: Medicare Other | Admitting: Gynecology

## 2015-01-31 VITALS — BP 130/80

## 2015-01-31 DIAGNOSIS — K649 Unspecified hemorrhoids: Secondary | ICD-10-CM

## 2015-01-31 DIAGNOSIS — K625 Hemorrhage of anus and rectum: Secondary | ICD-10-CM

## 2015-01-31 MED ORDER — LIDOCAINE-HYDROCORTISONE ACE 3-0.5 % RE CREA: 1.0000 | TOPICAL_CREAM | Freq: Two times a day (BID) | RECTAL | Status: DC

## 2015-01-31 NOTE — Progress Notes (Signed)
Cheryl Myers 03-31-1946 [id number]        69 y.o.  [id number] Presents with a history of occasional rectal bleeding with difficult bowel movement occurring maybe once a month. This morning had a bowel movement where she had frank red blood dripping into the toilet. Also some discomfort and pressure symptoms. Last colonoscopy 12 years ago by Dr. Earlean Shawl.  Past medical history,surgical history, problem list, medications, allergies, family history and social history were all reviewed and documented in the EPIC chart.  Directed ROS with pertinent positives and negatives documented in the history of present illness/assessment and plan.  Exam: [name] assistant Filed Vitals:   01/31/15 1413  BP: 130/80   General appearance:  Normal Abdomen soft nontender without masses guarding rebound Pelvic external BS vagina with atrophic changes. Cervix with atrophic changes. Uterus grossly normal size midline mobile nontender. Adnexa without masses or tenderness. Rectal exam shows old hemorrhoids but no active bleeding fissures or acute changes.  Assessment/Plan:  69 y.o. [id number] with episodes of rectal bleeding. I suspect it's due to internal hemorrhoids given the frank red blood acuteness. Recommend temporarily AnaMantle HC to use twice daily and she is going to call Dr. Earlean Shawl and arrange for evaluation by him and probable colonoscopy.    Anastasio Auerbach MD, 2:41 PM 01/31/2015

## 2015-01-31 NOTE — Patient Instructions (Signed)
Follow up with Dr. Earlean Shawl in reference to the rectal bleeding. Use the AnaMantle HC rectal cream twice daily as needed.

## 2015-02-03 ENCOUNTER — Telehealth: Payer: Self-pay | Admitting: *Deleted

## 2015-02-03 NOTE — Telephone Encounter (Signed)
Pt Rx for lidocaine/hydrocortisone cream 3-0.05 % from [facility] 01/31/15 is not a covered drug under her formulary. Can pt have alternate drug?

## 2015-02-03 NOTE — Telephone Encounter (Signed)
I need to know equivalents that are available from her formulary

## 2015-02-23 DIAGNOSIS — Z86010 Personal history of colonic polyps: Secondary | ICD-10-CM | POA: Diagnosis not present

## 2015-02-23 DIAGNOSIS — Z1211 Encounter for screening for malignant neoplasm of colon: Secondary | ICD-10-CM | POA: Diagnosis not present

## 2015-02-23 DIAGNOSIS — K5730 Diverticulosis of large intestine without perforation or abscess without bleeding: Secondary | ICD-10-CM | POA: Diagnosis not present

## 2015-02-23 DIAGNOSIS — K641 Second degree hemorrhoids: Secondary | ICD-10-CM | POA: Diagnosis not present

## 2015-02-23 DIAGNOSIS — D120 Benign neoplasm of cecum: Secondary | ICD-10-CM | POA: Diagnosis not present

## 2015-04-08 DIAGNOSIS — M545 Low back pain: Secondary | ICD-10-CM | POA: Diagnosis not present

## 2015-05-09 DIAGNOSIS — Z08 Encounter for follow-up examination after completed treatment for malignant neoplasm: Secondary | ICD-10-CM | POA: Diagnosis not present

## 2015-05-09 DIAGNOSIS — Z85828 Personal history of other malignant neoplasm of skin: Secondary | ICD-10-CM | POA: Diagnosis not present

## 2015-05-09 DIAGNOSIS — L820 Inflamed seborrheic keratosis: Secondary | ICD-10-CM | POA: Diagnosis not present

## 2015-05-09 DIAGNOSIS — L570 Actinic keratosis: Secondary | ICD-10-CM | POA: Diagnosis not present

## 2015-05-09 DIAGNOSIS — L578 Other skin changes due to chronic exposure to nonionizing radiation: Secondary | ICD-10-CM | POA: Diagnosis not present

## 2015-05-27 DIAGNOSIS — J0100 Acute maxillary sinusitis, unspecified: Secondary | ICD-10-CM | POA: Diagnosis not present

## 2015-07-10 ENCOUNTER — Other Ambulatory Visit: Payer: Self-pay

## 2015-07-10 MED ORDER — ALPRAZOLAM 0.5 MG PO TABS: 0.5000 mg | ORAL_TABLET | Freq: Two times a day (BID) | ORAL | Status: DC | PRN

## 2015-07-10 NOTE — Telephone Encounter (Signed)
Called into pharmacy

## 2015-07-10 NOTE — Telephone Encounter (Signed)
Patient requested refill on Xanax. Ce scheduled 09/01/15

## 2015-07-12 DIAGNOSIS — Z1231 Encounter for screening mammogram for malignant neoplasm of breast: Secondary | ICD-10-CM | POA: Diagnosis not present

## 2015-07-14 ENCOUNTER — Encounter: Payer: Self-pay | Admitting: Gynecology

## 2015-08-24 ENCOUNTER — Ambulatory Visit (INDEPENDENT_AMBULATORY_CARE_PROVIDER_SITE_OTHER): Payer: Medicare Other | Admitting: Internal Medicine

## 2015-08-24 ENCOUNTER — Encounter: Payer: Self-pay | Admitting: Internal Medicine

## 2015-08-24 VITALS — BP 124/80 | HR 96 | Ht 67.0 in | Wt 166.0 lb

## 2015-08-24 DIAGNOSIS — I10 Essential (primary) hypertension: Secondary | ICD-10-CM

## 2015-08-24 DIAGNOSIS — R0602 Shortness of breath: Secondary | ICD-10-CM

## 2015-08-24 LAB — CBC
HEMATOCRIT: 38.3 % (ref 36.0–46.0)
Hemoglobin: 12.5 g/dL (ref 12.0–15.0)
MCH: 29.6 pg (ref 26.0–34.0)
MCHC: 32.6 g/dL (ref 30.0–36.0)
MCV: 90.8 fL (ref 78.0–100.0)
MPV: 9.5 fL (ref 8.6–12.4)
PLATELETS: 318 10*3/uL (ref 150–400)
RBC: 4.22 MIL/uL (ref 3.87–5.11)
RDW: 13.8 % (ref 11.5–15.5)
WBC: 4.7 10*3/uL (ref 4.0–10.5)

## 2015-08-24 LAB — TSH: TSH: 3.18 m[IU]/L

## 2015-08-24 NOTE — Progress Notes (Signed)
Cardiology Office Note   Date:  08/24/2015   ID:  Cheryl Myers, DOB April 26, 1946, MRN [id number]  PCP:  No primary care provider on file.  Cardiologist:   Dorris Carnes, MD   Pt presents for eval of SOB     History of Present Illness: Cheryl Myers is a 70 y.o. female with a history of palpitations   Seen 8 years ago by [facility]   She presents today for eval of SOB   Pt says that she gets SOB now with stairs.  Started about 8 months ago  No CP  No PND   No SOB with walking on level ground     No palpitations   Rare dizzinesss    Owns a business   No exercising   Gained 6 lbs    Outpatient Prescriptions Prior to Visit  Medication Sig Dispense Refill  . Ibuprofen (ADVIL PO) Take by mouth.    . lidocaine-hydrocortisone (ANAMANTEL HC) 3-0.5 % CREA Place 1 Applicatorful rectally 2 (two) times daily. 1 Tube 1  . PARoxetine (PAXIL) 20 MG tablet Take 1 tablet (20 mg total) by mouth daily. 90 tablet 3  . ALPRAZolam (XANAX) 0.5 MG tablet Take 1 tablet (0.5 mg total) by mouth 2 (two) times daily as needed. for anxiety (Patient not taking: Reported on 08/24/2015) 30 tablet 1   No facility-administered medications prior to visit.     Allergies:   Review of patient's allergies indicates no known allergies.   Past Medical History  Diagnosis Date  . Anxiety   . Hypertension   . Cancer (HCC)     Basal and squamous cell skin cancer  . Osteopenia 08/2014    T score -1.9 FRAX 11%/1.8%    Past Surgical History  Procedure Laterality Date  . Basal and squamous cell skin cancers excised        Social History:  The patient  reports that she has never smoked. She does not have any smokeless tobacco history on file. She reports that she drinks alcohol. She reports that she does not use illicit drugs.   Family History:  The patient's family history includes Alzheimer's disease in her mother; Diabetes in her maternal aunt, maternal uncle, and mother; Heart disease in her  maternal uncle and maternal uncle; Melanoma in her brother, maternal uncle, and maternal uncle.    ROS:  Please see the history of present illness. All other systems are reviewed and  Negative to the above problem except as noted.    PHYSICAL EXAM: VS:  BP 124/80 mmHg  Pulse 96  Ht 5\' 7"  (1.702 m)  Wt 166 lb (75.297 kg)  BMI 25.99 kg/m2  GEN: Well nourished, well developed, in no acute distress HEENT: normal Neck: no JVD, carotid bruits, or masses Cardiac: RRR; no murmurs, rubs, or gallops,no edema  Respiratory:  clear to auscultation bilaterally, normal work of breathing GI: soft, nontender, nondistended, + BS  No hepatomegaly  MS: no deformity Moving all extremities   Skin: warm and dry, no rash Neuro:  Strength and sensation are intact Psych: euthymic mood, full affect   EKG:  EKG is ordered today.  SR 91 bpm     Lipid Panel No results found for: CHOL, TRIG, HDL, CHOLHDL, VLDL, LDLCALC, LDLDIRECT    Wt Readings from Last 3 Encounters:  08/24/15 166 lb (75.297 kg)  08/26/14 164 lb (74.39 kg)  08/18/13 157 lb (71.215 kg)      ASSESSMENT  AND PLAN:  1  DOE  Will get echo  CBC and TSH Continue activites as tolerated for now  2.  Palpitations  Pt denies    F/U will be based on test results        Signed, Dorris Carnes, MD  08/24/2015 8:05 PM    [facility] [address], [place], [place]  [address] Phone: [phone]; Fax: [phone]

## 2015-08-24 NOTE — Patient Instructions (Signed)
Your physician recommends that you continue on your current medications as directed. Please refer to the Current Medication list given to you today. Your physician recommends that you return for lab work in: today (CBC, TSH)  Your physician has requested that you have an echocardiogram. Echocardiography is a painless test that uses sound waves to create images of your heart. It provides your doctor with information about the size and shape of your heart and how well your heart's chambers and valves are working. This procedure takes approximately one hour. There are no restrictions for this procedure.

## 2015-09-01 ENCOUNTER — Encounter: Payer: Medicare Other | Admitting: Gynecology

## 2015-09-05 ENCOUNTER — Encounter: Payer: Self-pay | Admitting: Gynecology

## 2015-09-05 ENCOUNTER — Ambulatory Visit (INDEPENDENT_AMBULATORY_CARE_PROVIDER_SITE_OTHER): Payer: Medicare Other | Admitting: Gynecology

## 2015-09-05 VITALS — BP 124/80 | Ht 66.5 in | Wt 164.0 lb

## 2015-09-05 DIAGNOSIS — Z01419 Encounter for gynecological examination (general) (routine) without abnormal findings: Secondary | ICD-10-CM

## 2015-09-05 DIAGNOSIS — N952 Postmenopausal atrophic vaginitis: Secondary | ICD-10-CM | POA: Diagnosis not present

## 2015-09-05 DIAGNOSIS — M8580 Other specified disorders of bone density and structure, unspecified site: Secondary | ICD-10-CM | POA: Diagnosis not present

## 2015-09-05 MED ORDER — PAROXETINE HCL 20 MG PO TABS: 20.0000 mg | ORAL_TABLET | Freq: Every day | ORAL | Status: DC

## 2015-09-05 MED ORDER — ALPRAZOLAM 0.5 MG PO TABS: 0.5000 mg | ORAL_TABLET | Freq: Every evening | ORAL | Status: DC | PRN

## 2015-09-05 NOTE — Patient Instructions (Signed)
Make an appointment to establish primary care. One doctor is Dr. Stephen Hunter at  [facility]  #[phone]  Menopause is a normal process in which your reproductive ability comes to an end. This process happens gradually over a span of months to years, usually between the ages of 48 and 55. Menopause is complete when you have missed 12 consecutive menstrual periods. It is important to talk with your health care provider about some of the most common conditions that affect postmenopausal women, such as heart disease, cancer, and bone loss (osteoporosis). Adopting a healthy lifestyle and getting preventive care can help to promote your health and wellness. Those actions can also lower your chances of developing some of these common conditions. WHAT SHOULD I KNOW ABOUT MENOPAUSE? During menopause, you may experience a number of symptoms, such as:  Moderate-to-severe hot flashes.  Night sweats.  Decrease in sex drive.  Mood swings.  Headaches.  Tiredness.  Irritability.  Memory problems.  Insomnia. Choosing to treat or not to treat menopausal changes is an individual decision that you make with your health care provider. WHAT SHOULD I KNOW ABOUT HORMONE REPLACEMENT THERAPY AND SUPPLEMENTS? Hormone therapy products are effective for treating symptoms that are associated with menopause, such as hot flashes and night sweats. Hormone replacement carries certain risks, especially as you become older. If you are thinking about using estrogen or estrogen with progestin treatments, discuss the benefits and risks with your health care provider. WHAT SHOULD I KNOW ABOUT HEART DISEASE AND STROKE? Heart disease, heart attack, and stroke become more likely as you age. This may be due, in part, to the hormonal changes that your body experiences during menopause. These can affect how your body processes dietary fats, triglycerides, and cholesterol. Heart attack and stroke are both medical  emergencies. There are many things that you can do to help prevent heart disease and stroke:  Have your blood pressure checked at least every 1-2 years. High blood pressure causes heart disease and increases the risk of stroke.  If you are 55-79 years old, ask your health care provider if you should take aspirin to prevent a heart attack or a stroke.  Do not use any tobacco products, including cigarettes, chewing tobacco, or electronic cigarettes. If you need help quitting, ask your health care provider.  It is important to eat a healthy diet and maintain a healthy weight.  Be sure to include plenty of vegetables, fruits, low-fat dairy products, and lean protein.  Avoid eating foods that are high in solid fats, added sugars, or salt (sodium).  Get regular exercise. This is one of the most important things that you can do for your health.  Try to exercise for at least 150 minutes each week. The type of exercise that you do should increase your heart rate and make you sweat. This is known as moderate-intensity exercise.  Try to do strengthening exercises at least twice each week. Do these in addition to the moderate-intensity exercise.  Know your numbers.Ask your health care provider to check your cholesterol and your blood glucose. Continue to have your blood tested as directed by your health care provider. WHAT SHOULD I KNOW ABOUT CANCER SCREENING? There are several types of cancer. Take the following steps to reduce your risk and to catch any cancer development as early as possible. Breast Cancer  Practice breast self-awareness.  This means understanding how your breasts normally appear and feel.  It also means doing regular breast self-exams. Let your health care provider know   about any changes, no matter how small.  If you are 2 or older, have a clinician do a breast exam (clinical breast exam or CBE) every year. Depending on your age, family history, and medical history, it may  be recommended that you also have a yearly breast X-ray (mammogram).  If you have a family history of breast cancer, talk with your health care provider about genetic screening.  If you are at high risk for breast cancer, talk with your health care provider about having an MRI and a mammogram every year.  Breast cancer (BRCA) gene test is recommended for women who have family members with BRCA-related cancers. Results of the assessment will determine the need for genetic counseling and BRCA1 and for BRCA2 testing. BRCA-related cancers include these types:  Breast. This occurs in males or females.  Ovarian.  Tubal. This may also be called fallopian tube cancer.  Cancer of the abdominal or pelvic lining (peritoneal cancer).  Prostate.  Pancreatic. Cervical, Uterine, and Ovarian Cancer Your health care provider may recommend that you be screened regularly for cancer of the pelvic organs. These include your ovaries, uterus, and vagina. This screening involves a pelvic exam, which includes checking for microscopic changes to the surface of your cervix (Pap test).  For women ages 21-65, health care providers may recommend a pelvic exam and a Pap test every three years. For women ages 31-65, they may recommend the Pap test and pelvic exam, combined with testing for human papilloma virus (HPV), every five years. Some types of HPV increase your risk of cervical cancer. Testing for HPV may also be done on women of any age who have unclear Pap test results.  Other health care providers may not recommend any screening for nonpregnant women who are considered low risk for pelvic cancer and have no symptoms. Ask your health care provider if a screening pelvic exam is right for you.  If you have had past treatment for cervical cancer or a condition that could lead to cancer, you need Pap tests and screening for cancer for at least 20 years after your treatment. If Pap tests have been discontinued for you,  your risk factors (such as having a new sexual partner) need to be reassessed to determine if you should start having screenings again. Some women have medical problems that increase the chance of getting cervical cancer. In these cases, your health care provider may recommend that you have screening and Pap tests more often.  If you have a family history of uterine cancer or ovarian cancer, talk with your health care provider about genetic screening.  If you have vaginal bleeding after reaching menopause, tell your health care provider.  There are currently no reliable tests available to screen for ovarian cancer. Lung Cancer Lung cancer screening is recommended for adults 79-20 years old who are at high risk for lung cancer because of a history of smoking. A yearly low-dose CT scan of the lungs is recommended if you:  Currently smoke.  Have a history of at least 30 pack-years of smoking and you currently smoke or have quit within the past 15 years. A pack-year is smoking an average of one pack of cigarettes per day for one year. Yearly screening should:  Continue until it has been 15 years since you quit.  Stop if you develop a health problem that would prevent you from having lung cancer treatment. Colorectal Cancer  This type of cancer can be detected and can often be prevented.  Routine colorectal cancer screening usually begins at age 50 and continues through age 75.  If you have risk factors for colon cancer, your health care provider may recommend that you be screened at an earlier age.  If you have a family history of colorectal cancer, talk with your health care provider about genetic screening.  Your health care provider may also recommend using home test kits to check for hidden blood in your stool.  A small camera at the end of a tube can be used to examine your colon directly (sigmoidoscopy or colonoscopy). This is done to check for the earliest forms of colorectal  cancer.  Direct examination of the colon should be repeated every 5-10 years until age 75. However, if early forms of precancerous polyps or small growths are found or if you have a family history or genetic risk for colorectal cancer, you may need to be screened more often. Skin Cancer  Check your skin from head to toe regularly.  Monitor any moles. Be sure to tell your health care provider:  About any new moles or changes in moles, especially if there is a change in a mole's shape or color.  If you have a mole that is larger than the size of a pencil eraser.  If any of your family members has a history of skin cancer, especially at a young age, talk with your health care provider about genetic screening.  Always use sunscreen. Apply sunscreen liberally and repeatedly throughout the day.  Whenever you are outside, protect yourself by wearing long sleeves, pants, a wide-brimmed hat, and sunglasses. WHAT SHOULD I KNOW ABOUT OSTEOPOROSIS? Osteoporosis is a condition in which bone destruction happens more quickly than new bone creation. After menopause, you may be at an increased risk for osteoporosis. To help prevent osteoporosis or the bone fractures that can happen because of osteoporosis, the following is recommended:  If you are 19-50 years old, get at least 1,000 mg of calcium and at least 600 mg of vitamin D per day.  If you are older than age 50 but younger than age 70, get at least 1,200 mg of calcium and at least 600 mg of vitamin D per day.  If you are older than age 70, get at least 1,200 mg of calcium and at least 800 mg of vitamin D per day. Smoking and excessive alcohol intake increase the risk of osteoporosis. Eat foods that are rich in calcium and vitamin D, and do weight-bearing exercises several times each week as directed by your health care provider. WHAT SHOULD I KNOW ABOUT HOW MENOPAUSE AFFECTS MY MENTAL HEALTH? Depression may occur at any age, but it is more common as  you become older. Common symptoms of depression include:  Low or sad mood.  Changes in sleep patterns.  Changes in appetite or eating patterns.  Feeling an overall lack of motivation or enjoyment of activities that you previously enjoyed.  Frequent crying spells. Talk with your health care provider if you think that you are experiencing depression. WHAT SHOULD I KNOW ABOUT IMMUNIZATIONS? It is important that you get and maintain your immunizations. These include:  Tetanus, diphtheria, and pertussis (Tdap) booster vaccine.  Influenza every year before the flu season begins.  Pneumonia vaccine.  Shingles vaccine. Your health care provider may also recommend other immunizations.   This information is not intended to replace advice given to you by your health care provider. Make sure you discuss any questions you have with your health care   provider.   Document Released: 08/09/2005 Document Revised: 07/08/2014 Document Reviewed: 02/17/2014 Elsevier Interactive Patient Education 2016 [employer] 

## 2015-09-05 NOTE — Progress Notes (Signed)
    Cheryl Myers 31-Oct-1945 [id number]        70 y.o.  G2P2002  for breast and pelvic exam.  Past medical history,surgical history, problem list, medications, allergies, family history and social history were all reviewed and documented as reviewed in the EPIC chart.  ROS:  Performed with pertinent positives and negatives included in the history, assessment and plan.   Additional significant findings :  None.   Exam: Copywriter, [employer] Filed Vitals:   09/05/15 1058  BP: 124/80  Height: 5' 6.5" (1.689 m)  Weight: 164 lb (74.39 kg)   General appearance:  Normal affect, orientation and appearance. Skin: Grossly normal HEENT: Without gross lesions.  No cervical or supraclavicular adenopathy. Thyroid normal.  Lungs:  Clear without wheezing, rales or rhonchi Cardiac: RR, without RMG Abdominal:  Soft, nontender, without masses, guarding, rebound, organomegaly or hernia Breasts:  Examined lying and sitting without masses, retractions, discharge or axillary adenopathy. Pelvic:  Ext/BUS/vagina with atrophic changes  Cervix with atrophic changes  Uterus anteverted, normal size, shape and contour, midline and mobile nontender   Adnexa without masses or tenderness    Anus and perineum normal   Rectovaginal normal sphincter tone without palpated masses or tenderness.    Assessment/Plan:  70 y.o. [id number] female for breast and pelvic exam.   1. Postmenopausal/atrophic genital changes. Without significant hot flushes, night sweats, vaginal dryness or any vaginal bleeding. Continue to monitor and report any issues or vaginal bleeding. 2. Osteopenia. DEXA 08/2014 T score -1.9 FRAX 11%/1.8%. Increased calcium vitamin D reviewed. Repeat DEXA next year at  Two-year interval. 3. Anxiety. Patient on Paxil 20 mg daily doing well with this and wants to continue. Refill 1 year provided. Also uses Xanax 0.05 mg as needed for anxiety/sleep. #60 with 3 refills provided. 4. Mammography 07/2015. Continue with  annual mammography when due. SBE monthly reviewed. 5. Colonoscopy 2016. Repeat at their recommended interval. 6. Pap smear 2015. No Pap smear done today. No history of significant abnormal Pap smears previously. Options to stop screening altogether a less frequent screening intervals reviewed. Will readdress on an annual basis. 7. Health maintenance. No routine blood work done. Patient has yet to schedule an appointment with the primary physician. I strongly recommended that she make this appointment for ongoing healthcare and blood work. Names and numbers provided.  Patient agrees to call and schedule. Follow up in one year, sooner as needed.   Anastasio Auerbach MD, 11:22 AM 09/05/2015

## 2015-09-06 LAB — URINALYSIS W MICROSCOPIC + REFLEX CULTURE
BILIRUBIN URINE: NEGATIVE
Bacteria, UA: NONE SEEN [HPF]
CRYSTALS: NONE SEEN [HPF]
Casts: NONE SEEN [LPF]
Glucose, UA: NEGATIVE
Hgb urine dipstick: NEGATIVE
KETONES UR: NEGATIVE
Leukocytes, UA: NEGATIVE
NITRITE: NEGATIVE
PH: 6 (ref 5.0–8.0)
Protein, ur: NEGATIVE
RBC / HPF: NONE SEEN RBC/HPF (ref <=2)
SPECIFIC GRAVITY, URINE: 1.006 (ref 1.001–1.035)
SQUAMOUS EPITHELIAL / LPF: NONE SEEN [HPF] (ref <=5)
WBC UA: NONE SEEN WBC/HPF (ref <=5)
Yeast: NONE SEEN [HPF]

## 2015-09-12 ENCOUNTER — Other Ambulatory Visit: Payer: Self-pay

## 2015-09-12 ENCOUNTER — Ambulatory Visit (HOSPITAL_COMMUNITY): Payer: Medicare Other | Attending: Internal Medicine

## 2015-09-12 DIAGNOSIS — I340 Nonrheumatic mitral (valve) insufficiency: Secondary | ICD-10-CM | POA: Insufficient documentation

## 2015-09-12 DIAGNOSIS — R0602 Shortness of breath: Secondary | ICD-10-CM | POA: Insufficient documentation

## 2015-09-12 DIAGNOSIS — I071 Rheumatic tricuspid insufficiency: Secondary | ICD-10-CM | POA: Insufficient documentation

## 2015-09-12 DIAGNOSIS — I10 Essential (primary) hypertension: Secondary | ICD-10-CM

## 2015-09-12 DIAGNOSIS — R0600 Dyspnea, unspecified: Secondary | ICD-10-CM | POA: Diagnosis present

## 2015-09-12 DIAGNOSIS — I119 Hypertensive heart disease without heart failure: Secondary | ICD-10-CM | POA: Diagnosis not present

## 2015-09-19 ENCOUNTER — Telehealth: Payer: Self-pay | Admitting: *Deleted

## 2015-09-19 DIAGNOSIS — R0602 Shortness of breath: Secondary | ICD-10-CM

## 2015-09-19 NOTE — Telephone Encounter (Signed)
-----   Message from Fay Records, MD sent at 09/12/2015  8:05 PM EDT ----- Echo shows normal pumping function of heart  Mild relaxation abnormality (probably avg for pt's age) I would recomm that she have a stress myoview  I want pt to walk if possible

## 2015-09-19 NOTE — Telephone Encounter (Signed)
Spoke with patient.  Provided echo results. Pt agreeable to having myoview. Advised of instructions and that scheduler with call her to schedule. Verbalizes understanding.

## 2015-09-27 ENCOUNTER — Telehealth (HOSPITAL_COMMUNITY): Payer: Self-pay | Admitting: *Deleted

## 2015-09-27 NOTE — Telephone Encounter (Signed)
Left message on voicemail in reference to upcoming appointment scheduled for 10/03/15. Phone number given for a call back so details instructions can be given. Hubbard Robinson, RN

## 2015-09-28 ENCOUNTER — Telehealth (HOSPITAL_COMMUNITY): Payer: Self-pay | Admitting: *Deleted

## 2015-09-28 NOTE — Telephone Encounter (Signed)
Left message on voicemail in reference to upcoming appointment scheduled for 10/03/15. Phone number given for a call back so details instructions can be given. Hubbard Robinson, RN

## 2015-10-02 ENCOUNTER — Telehealth (HOSPITAL_COMMUNITY): Payer: Self-pay | Admitting: *Deleted

## 2015-10-02 NOTE — Telephone Encounter (Signed)
Patient given detailed instructions per Myocardial Perfusion Study Information Sheet for the test on 10/03/15 at 0945. Patient notified to arrive 15 minutes early and that it is imperative to arrive on time for appointment to keep from having the test rescheduled.  If you need to cancel or reschedule your appointment, please call the office within 24 hours of your appointment. Failure to do so may result in a cancellation of your appointment, and a $50 no show fee. Patient verbalized understanding.[name], Ranae Palms

## 2015-10-03 ENCOUNTER — Ambulatory Visit (HOSPITAL_COMMUNITY): Payer: Medicare Other | Attending: Cardiology

## 2015-10-03 DIAGNOSIS — R0602 Shortness of breath: Secondary | ICD-10-CM | POA: Diagnosis not present

## 2015-10-03 DIAGNOSIS — R0609 Other forms of dyspnea: Secondary | ICD-10-CM | POA: Insufficient documentation

## 2015-10-03 DIAGNOSIS — I10 Essential (primary) hypertension: Secondary | ICD-10-CM | POA: Insufficient documentation

## 2015-10-03 LAB — MYOCARDIAL PERFUSION IMAGING
CHL CUP RESTING HR STRESS: 69 {beats}/min
Estimated workload: 7 METS
Exercise duration (min): 5 min
Exercise duration (sec): 0 s
MPHR: 151 {beats}/min
Peak HR: 151 {beats}/min
Percent HR: 100 %

## 2015-10-03 MED ORDER — TECHNETIUM TC 99M SESTAMIBI GENERIC - CARDIOLITE
32.7000 | Freq: Once | INTRAVENOUS | Status: AC | PRN
  Administered 2015-10-03: 32.7 via INTRAVENOUS

## 2015-10-03 MED ORDER — TECHNETIUM TC 99M SESTAMIBI GENERIC - CARDIOLITE
10.1000 | Freq: Once | INTRAVENOUS | Status: AC | PRN
  Administered 2015-10-03: 10.1 via INTRAVENOUS

## 2015-11-07 DIAGNOSIS — Z85828 Personal history of other malignant neoplasm of skin: Secondary | ICD-10-CM | POA: Diagnosis not present

## 2015-11-07 DIAGNOSIS — L821 Other seborrheic keratosis: Secondary | ICD-10-CM | POA: Diagnosis not present

## 2015-11-07 DIAGNOSIS — Z08 Encounter for follow-up examination after completed treatment for malignant neoplasm: Secondary | ICD-10-CM | POA: Diagnosis not present

## 2015-11-07 DIAGNOSIS — L570 Actinic keratosis: Secondary | ICD-10-CM | POA: Diagnosis not present

## 2015-11-07 DIAGNOSIS — L578 Other skin changes due to chronic exposure to nonionizing radiation: Secondary | ICD-10-CM | POA: Diagnosis not present

## 2016-03-29 ENCOUNTER — Other Ambulatory Visit: Payer: Self-pay | Admitting: Gynecology

## 2016-03-29 NOTE — Telephone Encounter (Signed)
Prescription called in [facility] 

## 2016-04-09 ENCOUNTER — Telehealth: Payer: Self-pay

## 2016-06-06 DIAGNOSIS — L570 Actinic keratosis: Secondary | ICD-10-CM | POA: Diagnosis not present

## 2016-06-19 ENCOUNTER — Telehealth: Payer: Self-pay | Admitting: *Deleted

## 2016-06-19 MED ORDER — ALPRAZOLAM 0.5 MG PO TABS: ORAL_TABLET | ORAL | 0 refills | Status: DC

## 2016-06-19 NOTE — Telephone Encounter (Signed)
Rx called in. Left on pt voicemail this has been done

## 2016-06-19 NOTE — Telephone Encounter (Signed)
Okay for #60 with no refills

## 2016-06-19 NOTE — Telephone Encounter (Signed)
Patient called requesting refill on Xanax 0.5 mg as needed for anxiety/sleep #60 , last filled on 03/29/16 #60 with no refills. Please advise

## 2016-08-13 DIAGNOSIS — F5101 Primary insomnia: Secondary | ICD-10-CM | POA: Diagnosis not present

## 2016-08-13 DIAGNOSIS — H9209 Otalgia, unspecified ear: Secondary | ICD-10-CM | POA: Diagnosis not present

## 2016-08-13 DIAGNOSIS — I10 Essential (primary) hypertension: Secondary | ICD-10-CM | POA: Diagnosis not present

## 2016-08-19 DIAGNOSIS — J0100 Acute maxillary sinusitis, unspecified: Secondary | ICD-10-CM | POA: Diagnosis not present

## 2016-09-05 ENCOUNTER — Encounter: Payer: Self-pay | Admitting: Gynecology

## 2016-09-05 ENCOUNTER — Ambulatory Visit (INDEPENDENT_AMBULATORY_CARE_PROVIDER_SITE_OTHER): Payer: Medicare Other | Admitting: Gynecology

## 2016-09-05 VITALS — BP 154/90 | Ht 67.0 in | Wt 162.0 lb

## 2016-09-05 DIAGNOSIS — Z01411 Encounter for gynecological examination (general) (routine) with abnormal findings: Secondary | ICD-10-CM | POA: Diagnosis not present

## 2016-09-05 DIAGNOSIS — M899 Disorder of bone, unspecified: Secondary | ICD-10-CM

## 2016-09-05 DIAGNOSIS — N952 Postmenopausal atrophic vaginitis: Secondary | ICD-10-CM

## 2016-09-05 DIAGNOSIS — I10 Essential (primary) hypertension: Secondary | ICD-10-CM

## 2016-09-05 DIAGNOSIS — Z124 Encounter for screening for malignant neoplasm of cervix: Secondary | ICD-10-CM

## 2016-09-05 MED ORDER — PAROXETINE HCL 20 MG PO TABS: 20.0000 mg | ORAL_TABLET | Freq: Every day | ORAL | 3 refills | Status: DC

## 2016-09-05 MED ORDER — ALPRAZOLAM 0.5 MG PO TABS: ORAL_TABLET | ORAL | 2 refills | Status: DC

## 2016-09-05 NOTE — Addendum Note (Signed)
Addended by: Nelva Nay on: 09/05/2016 12:17 PM   Modules accepted: Orders

## 2016-09-05 NOTE — Patient Instructions (Signed)
Follow up for the bone density as scheduled  Schedule your mammogram. 

## 2016-09-05 NOTE — Progress Notes (Signed)
    Cheryl Myers 04/13/1946 [id number]        71 y.o.  G2P2002 for breast and pelvic exam.  Past medical history,surgical history, problem list, medications, allergies, family history and social history were all reviewed and documented as reviewed in the EPIC chart.  ROS:  Performed with pertinent positives and negatives included in the history, assessment and plan.   Additional significant findings :  none   Exam: Caryn Bee assistant Vitals:   09/05/16 1131  BP: (!) 154/90  Weight: 162 lb (73.5 kg)  Height: 5\' 7"  (1.702 m)   Body mass index is 25.37 kg/m.  General appearance:  Normal affect, orientation and appearance. Skin: Grossly normal HEENT: Without gross lesions.  No cervical or supraclavicular adenopathy. Thyroid normal.  Lungs:  Clear without wheezing, rales or rhonchi Cardiac: RR, without RMG Abdominal:  Soft, nontender, without masses, guarding, rebound, organomegaly or hernia Breasts:  Examined lying and sitting without masses, retractions, discharge or axillary adenopathy. Pelvic:  Ext, BUS, Vagina: with atrophic changes  Cervix: with atrophic changes. Pap smear done  Uterus: anteverted, normal size, shape and contour, midline and mobile nontender   Adnexa: Without masses or tenderness    Anus and perineum: Normal   Rectovaginal: Normal sphincter tone without palpated masses or tenderness.    Assessment/Plan:  71 y.o. [id number] female for breast and pelvic exam  1. Postmenopausal/atrophic genital changes. No significant hot flushes, night sweats, vaginal dryness or any vaginal bleeding. Report any issues or bleeding. 2. Osteopenia. DEXA 08/2014 T score -1.9 FRAX 11%/1.8%. Schedule DEXA now to year interval. 3. Mammography 07/2015. Recommended patient schedule mammography now she agrees to do so. SBE monthly reviewed. 4. Pap smear 2015. Pap smear done today. No history of abnormal Pap smears previously. Options to stop screening based on current screening  guidelines and age reviewed. 5. Colonoscopy 2016. Repeat at their recommended interval. 6. Anxiety. On Paxil 20 mg daily doing well. Uses Xanax 0.5 mg intermittently for sleep and anxiety. Refill Paxil 1 year. Xanax No. 60 with 2 refills provided. Son has liver failure, end-stage and daughter being treated for an past. Patient does not feel that she could wean from her medications at this point. 7. Health maintenance. Blood pressure elevated at 154/90. Saw a primary physician and [place] [facility] and was prescribed medication but discontinued it on her own. I strongly recommend she follow up with a primary physician to follow her blood pressure and be treated as needed. Patient agrees to arrange. No blood work done today she will do this through her primary physician's office. Follow up for the bone density and mammogram otherwise follow up in one year for annual exam.   Anastasio Auerbach MD, 11:59 AM 09/05/2016

## 2016-09-06 LAB — PAP IG W/ RFLX HPV ASCU

## 2016-09-09 DIAGNOSIS — J0100 Acute maxillary sinusitis, unspecified: Secondary | ICD-10-CM | POA: Diagnosis not present

## 2016-09-26 ENCOUNTER — Ambulatory Visit (INDEPENDENT_AMBULATORY_CARE_PROVIDER_SITE_OTHER): Payer: Medicare Other | Admitting: Nurse Practitioner

## 2016-09-26 ENCOUNTER — Encounter: Payer: Self-pay | Admitting: Nurse Practitioner

## 2016-09-26 VITALS — BP 170/104 | HR 69 | Temp 98.1°F | Ht 67.0 in | Wt 161.0 lb

## 2016-09-26 DIAGNOSIS — I10 Essential (primary) hypertension: Secondary | ICD-10-CM | POA: Diagnosis not present

## 2016-09-26 DIAGNOSIS — F419 Anxiety disorder, unspecified: Secondary | ICD-10-CM | POA: Insufficient documentation

## 2016-09-26 DIAGNOSIS — F411 Generalized anxiety disorder: Secondary | ICD-10-CM | POA: Insufficient documentation

## 2016-09-26 MED ORDER — METOPROLOL SUCCINATE ER 50 MG PO TB24: 50.0000 mg | ORAL_TABLET | Freq: Every day | ORAL | 3 refills | Status: DC

## 2016-09-26 NOTE — Progress Notes (Signed)
Pre visit review using our clinic review tool, if applicable. No additional management support is needed unless otherwise documented below in the visit note. 

## 2016-09-26 NOTE — Patient Instructions (Addendum)
Check BP once a day (morning) and record. Bring BP reading to next OV.  call office if BP <100/60 for more than 24hrs.  Hypertension Hypertension, commonly called high blood pressure, is when the force of blood pumping through the arteries is too strong. The arteries are the blood vessels that carry blood from the heart throughout the body. Hypertension forces the heart to work harder to pump blood and may cause arteries to become narrow or stiff. Having untreated or uncontrolled hypertension can cause heart attacks, strokes, kidney disease, and other problems. A blood pressure reading consists of a higher number over a lower number. Ideally, your blood pressure should be below 120/80. The first ("top") number is called the systolic pressure. It is a measure of the pressure in your arteries as your heart beats. The second ("bottom") number is called the diastolic pressure. It is a measure of the pressure in your arteries as the heart relaxes. What are the causes? The cause of this condition is not known. What increases the risk? Some risk factors for high blood pressure are under your control. Others are not. Factors you can change   Smoking.  Having type 2 diabetes mellitus, high cholesterol, or both.  Not getting enough exercise or physical activity.  Being overweight.  Having too much fat, sugar, calories, or salt (sodium) in your diet.  Drinking too much alcohol. Factors that are difficult or impossible to change   Having chronic kidney disease.  Having a family history of high blood pressure.  Age. Risk increases with age.  Race. You may be at higher risk if you are African-American.  Gender. Men are at higher risk than women before age 50. After age 56, women are at higher risk than men.  Having obstructive sleep apnea.  Stress. What are the signs or symptoms? Extremely high blood pressure (hypertensive crisis) may cause:  Headache.  Anxiety.  Shortness of  breath.  Nosebleed.  Nausea and vomiting.  Severe chest pain.  Jerky movements you cannot control (seizures). How is this diagnosed? This condition is diagnosed by measuring your blood pressure while you are seated, with your arm resting on a surface. The cuff of the blood pressure monitor will be placed directly against the skin of your upper arm at the level of your heart. It should be measured at least twice using the same arm. Certain conditions can cause a difference in blood pressure between your right and left arms. Certain factors can cause blood pressure readings to be lower or higher than normal (elevated) for a short period of time:  When your blood pressure is higher when you are in a health care provider's office than when you are at home, this is called white coat hypertension. Most people with this condition do not need medicines.  When your blood pressure is higher at home than when you are in a health care provider's office, this is called masked hypertension. Most people with this condition may need medicines to control blood pressure. If you have a high blood pressure reading during one visit or you have normal blood pressure with other risk factors:  You may be asked to return on a different day to have your blood pressure checked again.  You may be asked to monitor your blood pressure at home for 1 week or longer. If you are diagnosed with hypertension, you may have other blood or imaging tests to help your health care provider understand your overall risk for other conditions. How is  this treated? This condition is treated by making healthy lifestyle changes, such as eating healthy foods, exercising more, and reducing your alcohol intake. Your health care provider may prescribe medicine if lifestyle changes are not enough to get your blood pressure under control, and if:  Your systolic blood pressure is above 130.  Your diastolic blood pressure is above 80. Your  personal target blood pressure may vary depending on your medical conditions, your age, and other factors. Follow these instructions at home: Eating and drinking   Eat a diet that is high in fiber and potassium, and low in sodium, added sugar, and fat. An example eating plan is called the DASH (Dietary Approaches to Stop Hypertension) diet. To eat this way:  Eat plenty of fresh fruits and vegetables. Try to fill half of your plate at each meal with fruits and vegetables.  Eat whole grains, such as whole wheat pasta, brown rice, or whole grain bread. Fill about one quarter of your plate with whole grains.  Eat or drink low-fat dairy products, such as skim milk or low-fat yogurt.  Avoid fatty cuts of meat, processed or cured meats, and poultry with skin. Fill about one quarter of your plate with lean proteins, such as fish, chicken without skin, beans, eggs, and tofu.  Avoid premade and processed foods. These tend to be higher in sodium, added sugar, and fat.  Reduce your daily sodium intake. Most people with hypertension should eat less than 1,500 mg of sodium a day.  Limit alcohol intake to no more than 1 drink a day for nonpregnant women and 2 drinks a day for men. One drink equals 12 oz of beer, 5 oz of wine, or 1 oz of hard liquor. Lifestyle   Work with your health care provider to maintain a healthy body weight or to lose weight. Ask what an ideal weight is for you.  Get at least 30 minutes of exercise that causes your heart to beat faster (aerobic exercise) most days of the week. Activities may include walking, swimming, or biking.  Include exercise to strengthen your muscles (resistance exercise), such as pilates or lifting weights, as part of your weekly exercise routine. Try to do these types of exercises for 30 minutes at least 3 days a week.  Do not use any products that contain nicotine or tobacco, such as cigarettes and e-cigarettes. If you need help quitting, ask your health  care provider.  Monitor your blood pressure at home as told by your health care provider.  Keep all follow-up visits as told by your health care provider. This is important. Medicines   Take over-the-counter and prescription medicines only as told by your health care provider. Follow directions carefully. Blood pressure medicines must be taken as prescribed.  Do not skip doses of blood pressure medicine. Doing this puts you at risk for problems and can make the medicine less effective.  Ask your health care provider about side effects or reactions to medicines that you should watch for. Contact a health care provider if:  You think you are having a reaction to a medicine you are taking.  You have headaches that keep coming back (recurring).  You feel dizzy.  You have swelling in your ankles.  You have trouble with your vision. Get help right away if:  You develop a severe headache or confusion.  You have unusual weakness or numbness.  You feel faint.  You have severe pain in your chest or abdomen.  You vomit repeatedly.  You have trouble breathing. Summary  Hypertension is when the force of blood pumping through your arteries is too strong. If this condition is not controlled, it may put you at risk for serious complications.  Your personal target blood pressure may vary depending on your medical conditions, your age, and other factors. For most people, a normal blood pressure is less than 120/80.  Hypertension is treated with lifestyle changes, medicines, or a combination of both. Lifestyle changes include weight loss, eating a healthy, low-sodium diet, exercising more, and limiting alcohol. This information is not intended to replace advice given to you by your health care provider. Make sure you discuss any questions you have with your health care provider. Document Released: 06/17/2005 Document Revised: 05/15/2016 Document Reviewed: 05/15/2016 Elsevier Interactive  Patient Education  2017 [employer].

## 2016-09-26 NOTE — Progress Notes (Signed)
Subjective:  Patient ID: Cheryl Myers, female    DOB: 10/04/1945  Age: 71 y.o. MRN: [id number]  CC: Establish Care (est care/right ear problem/pain and BP problem and blood work? tdap?)   Hypertension  This is a chronic problem. The current episode started more than 1 year ago. The problem has been waxing and waning since onset. The problem is uncontrolled. Associated symptoms include anxiety and headaches. Pertinent negatives include no blurred vision, chest pain, malaise/fatigue, neck pain, orthopnea, palpitations, peripheral edema, PND, shortness of breath or sweats. Risk factors for coronary artery disease include sedentary lifestyle, post-menopausal state and family history. Past treatments include beta blockers. The current treatment provides significant improvement. Compliance problems: toprol sed in past, she stopped medication once BP was undercontrol. does not check BP at home.    No previous pcp.   Anxiety: current patient of Dr. Phineas Real (GYN): manages anxiety with xanax and Paxil. Denies any SI or HI. No previous suicide attempts.  Outpatient Medications Prior to Visit  Medication Sig Dispense Refill  . ALPRAZolam (XANAX) 0.5 MG tablet TAKE 1 TABLET BY MOUTH AT BEDTIME AS NEEDED FOR ANXIETY 60 tablet 2  . Cholecalciferol (VITAMIN D PO) Take by mouth.    . Ibuprofen (ADVIL PO) Take by mouth.    . lidocaine-hydrocortisone (ANAMANTEL HC) 3-0.5 % CREA Place 1 Applicatorful rectally 2 (two) times daily. 1 Tube 1  . PARoxetine (PAXIL) 20 MG tablet Take 1 tablet (20 mg total) by mouth daily. 90 tablet 3   No facility-administered medications prior to visit.     ROS See HPI  Objective:  BP (!) 170/104   Pulse 69   Temp 98.1 F (36.7 C)   Ht 5\' 7"  (1.702 m)   Wt 161 lb (73 kg)   SpO2 99%   BMI 25.22 kg/m   BP Readings from Last 3 Encounters:  09/26/16 (!) 170/104  09/05/16 (!) 154/90  09/05/15 124/80    Wt Readings from Last 3 Encounters:  09/26/16 161 lb (73  kg)  09/05/16 162 lb (73.5 kg)  09/05/15 164 lb (74.4 kg)    Physical Exam  Constitutional: She is oriented to person, place, and time. No distress.  HENT:  Right Ear: External ear normal.  Nose: Nose normal.  Eyes: No scleral icterus.  Neck: Normal range of motion. Neck supple.  Cardiovascular: Normal rate, regular rhythm and normal heart sounds.   Pulmonary/Chest: Effort normal and breath sounds normal. No respiratory distress.  Abdominal: Soft. She exhibits no distension.  Musculoskeletal: Normal range of motion. She exhibits no edema.  Lymphadenopathy:    She has no cervical adenopathy.  Neurological: She is alert and oriented to person, place, and time.  Skin: Skin is warm and dry.  Psychiatric: She has a normal mood and affect. Her behavior is normal.  Vitals reviewed.   Lab Results  Component Value Date   WBC 4.7 08/24/2015   HGB 12.5 08/24/2015   HCT 38.3 08/24/2015   PLT 318 08/24/2015   GLUCOSE 96 11/12/2013   ALT 15 11/12/2013   AST 15 11/12/2013   NA 139 11/12/2013   K 4.9 11/12/2013   CL 104 11/12/2013   CREATININE 0.72 11/12/2013   BUN 20 11/12/2013   CO2 29 11/12/2013   TSH 3.18 08/24/2015    No results found.  Assessment & Plan:   Cheryl Myers was seen today for establish care.  Diagnoses and all orders for this visit:  Essential hypertension -     metoprolol  succinate (TOPROL-XL) 50 MG 24 hr tablet; Take 1 tablet (50 mg total) by mouth daily. Take with or immediately following a meal.   I am having Cheryl Myers start on metoprolol succinate. I am also having her maintain her Ibuprofen (ADVIL PO), lidocaine-hydrocortisone, Cholecalciferol (VITAMIN D PO), PARoxetine, and ALPRAZolam.  Meds ordered this encounter  Medications  . metoprolol succinate (TOPROL-XL) 50 MG 24 hr tablet    Sig: Take 1 tablet (50 mg total) by mouth daily. Take with or immediately following a meal.    Dispense:  30 tablet    Refill:  3    Order Specific Question:   Supervising  Provider    Answer:   Cassandria Anger [[id number]]    Follow-up: Return in about 2 weeks (around 10/10/2016) for HTN (fasting, 56mins appt).  Wilfred Lacy, NP

## 2016-10-04 ENCOUNTER — Other Ambulatory Visit (INDEPENDENT_AMBULATORY_CARE_PROVIDER_SITE_OTHER): Payer: Medicare Other

## 2016-10-04 ENCOUNTER — Ambulatory Visit (INDEPENDENT_AMBULATORY_CARE_PROVIDER_SITE_OTHER): Payer: Medicare Other | Admitting: Nurse Practitioner

## 2016-10-04 ENCOUNTER — Encounter: Payer: Self-pay | Admitting: Nurse Practitioner

## 2016-10-04 VITALS — BP 134/78 | HR 49 | Temp 97.8°F | Ht 67.0 in | Wt 161.0 lb

## 2016-10-04 DIAGNOSIS — Z136 Encounter for screening for cardiovascular disorders: Secondary | ICD-10-CM | POA: Diagnosis not present

## 2016-10-04 DIAGNOSIS — Z23 Encounter for immunization: Secondary | ICD-10-CM | POA: Diagnosis not present

## 2016-10-04 DIAGNOSIS — Z13220 Encounter for screening for lipoid disorders: Secondary | ICD-10-CM | POA: Diagnosis not present

## 2016-10-04 DIAGNOSIS — I10 Essential (primary) hypertension: Secondary | ICD-10-CM

## 2016-10-04 LAB — COMPREHENSIVE METABOLIC PANEL
ALBUMIN: 4.2 g/dL (ref 3.5–5.2)
ALT: 11 U/L (ref 0–35)
AST: 12 U/L (ref 0–37)
Alkaline Phosphatase: 66 U/L (ref 39–117)
BUN: 13 mg/dL (ref 6–23)
CHLORIDE: 106 meq/L (ref 96–112)
CO2: 33 meq/L — AB (ref 19–32)
CREATININE: 0.82 mg/dL (ref 0.40–1.20)
Calcium: 9.9 mg/dL (ref 8.4–10.5)
GFR: 73.21 mL/min (ref >60.00)
Glucose, Bld: 107 mg/dL — ABNORMAL HIGH (ref 70–99)
Potassium: 5.1 mEq/L (ref 3.5–5.1)
SODIUM: 142 meq/L (ref 135–145)
Total Bilirubin: 0.7 mg/dL (ref 0.2–1.2)
Total Protein: 6.7 g/dL (ref 6.0–8.3)

## 2016-10-04 LAB — LIPID PANEL
Cholesterol: 222 mg/dL — ABNORMAL HIGH (ref 0–200)
HDL: 61.6 mg/dL (ref >39.00)
LDL CALC: 144 mg/dL — AB (ref 0–99)
NONHDL: 160.88
Total CHOL/HDL Ratio: 4
Triglycerides: 82 mg/dL (ref 0.0–149.0)
VLDL: 16.4 mg/dL (ref 0.0–40.0)

## 2016-10-04 MED ORDER — METOPROLOL SUCCINATE ER 25 MG PO TB24: 25.0000 mg | ORAL_TABLET | Freq: Every day | ORAL | 1 refills | Status: DC

## 2016-10-04 NOTE — Progress Notes (Signed)
Pre visit review using our clinic review tool, if applicable. No additional management support is needed unless otherwise documented below in the visit note. 

## 2016-10-04 NOTE — Patient Instructions (Signed)
If you have medicare related insurance (such as traditional Medicare, [employer], [employer], or similar), Please make an appointment at the scheduling desk with Sharee Pimple, the [employer], for your Wellness visit in this office, which is a benefit with your insurance.   Hypertension Hypertension, commonly called high blood pressure, is when the force of blood pumping through the arteries is too strong. The arteries are the blood vessels that carry blood from the heart throughout the body. Hypertension forces the heart to work harder to pump blood and may cause arteries to become narrow or stiff. Having untreated or uncontrolled hypertension can cause heart attacks, strokes, kidney disease, and other problems. A blood pressure reading consists of a higher number over a lower number. Ideally, your blood pressure should be below 120/80. The first ("top") number is called the systolic pressure. It is a measure of the pressure in your arteries as your heart beats. The second ("bottom") number is called the diastolic pressure. It is a measure of the pressure in your arteries as the heart relaxes. What are the causes? The cause of this condition is not known. What increases the risk? Some risk factors for high blood pressure are under your control. Others are not. Factors you can change   Smoking.  Having type 2 diabetes mellitus, high cholesterol, or both.  Not getting enough exercise or physical activity.  Being overweight.  Having too much fat, sugar, calories, or salt (sodium) in your diet.  Drinking too much alcohol. Factors that are difficult or impossible to change   Having chronic kidney disease.  Having a family history of high blood pressure.  Age. Risk increases with age.  Race. You may be at higher risk if you are African-American.  Gender. Men are at higher risk than women before age 69. After age 68, women are at higher risk than men.  Having  obstructive sleep apnea.  Stress. What are the signs or symptoms? Extremely high blood pressure (hypertensive crisis) may cause:  Headache.  Anxiety.  Shortness of breath.  Nosebleed.  Nausea and vomiting.  Severe chest pain.  Jerky movements you cannot control (seizures). How is this diagnosed? This condition is diagnosed by measuring your blood pressure while you are seated, with your arm resting on a surface. The cuff of the blood pressure monitor will be placed directly against the skin of your upper arm at the level of your heart. It should be measured at least twice using the same arm. Certain conditions can cause a difference in blood pressure between your right and left arms. Certain factors can cause blood pressure readings to be lower or higher than normal (elevated) for a short period of time:  When your blood pressure is higher when you are in a health care provider's office than when you are at home, this is called white coat hypertension. Most people with this condition do not need medicines.  When your blood pressure is higher at home than when you are in a health care provider's office, this is called masked hypertension. Most people with this condition may need medicines to control blood pressure. If you have a high blood pressure reading during one visit or you have normal blood pressure with other risk factors:  You may be asked to return on a different day to have your blood pressure checked again.  You may be asked to monitor your blood pressure at home for 1 week or longer. If you are diagnosed with hypertension,  you may have other blood or imaging tests to help your health care provider understand your overall risk for other conditions. How is this treated? This condition is treated by making healthy lifestyle changes, such as eating healthy foods, exercising more, and reducing your alcohol intake. Your health care provider may prescribe medicine if lifestyle  changes are not enough to get your blood pressure under control, and if:  Your systolic blood pressure is above 130.  Your diastolic blood pressure is above 80. Your personal target blood pressure may vary depending on your medical conditions, your age, and other factors. Follow these instructions at home: Eating and drinking   Eat a diet that is high in fiber and potassium, and low in sodium, added sugar, and fat. An example eating plan is called the DASH (Dietary Approaches to Stop Hypertension) diet. To eat this way:  Eat plenty of fresh fruits and vegetables. Try to fill half of your plate at each meal with fruits and vegetables.  Eat whole grains, such as whole wheat pasta, brown rice, or whole grain bread. Fill about one quarter of your plate with whole grains.  Eat or drink low-fat dairy products, such as skim milk or low-fat yogurt.  Avoid fatty cuts of meat, processed or cured meats, and poultry with skin. Fill about one quarter of your plate with lean proteins, such as fish, chicken without skin, beans, eggs, and tofu.  Avoid premade and processed foods. These tend to be higher in sodium, added sugar, and fat.  Reduce your daily sodium intake. Most people with hypertension should eat less than 1,500 mg of sodium a day.  Limit alcohol intake to no more than 1 drink a day for nonpregnant women and 2 drinks a day for men. One drink equals 12 oz of beer, 5 oz of wine, or 1 oz of hard liquor. Lifestyle   Work with your health care provider to maintain a healthy body weight or to lose weight. Ask what an ideal weight is for you.  Get at least 30 minutes of exercise that causes your heart to beat faster (aerobic exercise) most days of the week. Activities may include walking, swimming, or biking.  Include exercise to strengthen your muscles (resistance exercise), such as pilates or lifting weights, as part of your weekly exercise routine. Try to do these types of exercises for 30  minutes at least 3 days a week.  Do not use any products that contain nicotine or tobacco, such as cigarettes and e-cigarettes. If you need help quitting, ask your health care provider.  Monitor your blood pressure at home as told by your health care provider.  Keep all follow-up visits as told by your health care provider. This is important. Medicines   Take over-the-counter and prescription medicines only as told by your health care provider. Follow directions carefully. Blood pressure medicines must be taken as prescribed.  Do not skip doses of blood pressure medicine. Doing this puts you at risk for problems and can make the medicine less effective.  Ask your health care provider about side effects or reactions to medicines that you should watch for. Contact a health care provider if:  You think you are having a reaction to a medicine you are taking.  You have headaches that keep coming back (recurring).  You feel dizzy.  You have swelling in your ankles.  You have trouble with your vision. Get help right away if:  You develop a severe headache or confusion.  You  have unusual weakness or numbness.  You feel faint.  You have severe pain in your chest or abdomen.  You vomit repeatedly.  You have trouble breathing. Summary  Hypertension is when the force of blood pumping through your arteries is too strong. If this condition is not controlled, it may put you at risk for serious complications.  Your personal target blood pressure may vary depending on your medical conditions, your age, and other factors. For most people, a normal blood pressure is less than 120/80.  Hypertension is treated with lifestyle changes, medicines, or a combination of both. Lifestyle changes include weight loss, eating a healthy, low-sodium diet, exercising more, and limiting alcohol. This information is not intended to replace advice given to you by your health care provider. Make sure you  discuss any questions you have with your health care provider. Document Released: 06/17/2005 Document Revised: 05/15/2016 Document Reviewed: 05/15/2016 Elsevier Interactive Patient Education  2017 [employer].

## 2016-10-04 NOTE — Progress Notes (Signed)
Subjective:  Patient ID: Cheryl Myers, female    DOB: 10-Oct-1945  Age: 71 y.o. MRN: [id number]  CC: Follow-up (follow up for BP)   HPI  HTN: Home readings 110/60 to 120/70. Denies any increased fatigue or  Sleepiness or dizziness or edema or SOB or headache.  She is fasting today and will like lipid panel and CMP checked today.  Outpatient Medications Prior to Visit  Medication Sig Dispense Refill  . ALPRAZolam (XANAX) 0.5 MG tablet TAKE 1 TABLET BY MOUTH AT BEDTIME AS NEEDED FOR ANXIETY 60 tablet 2  . Cholecalciferol (VITAMIN D PO) Take by mouth.    . Ibuprofen (ADVIL PO) Take by mouth.    Marland Kitchen PARoxetine (PAXIL) 20 MG tablet Take 1 tablet (20 mg total) by mouth daily. 90 tablet 3  . metoprolol succinate (TOPROL-XL) 50 MG 24 hr tablet Take 1 tablet (50 mg total) by mouth daily. Take with or immediately following a meal. 30 tablet 3  . lidocaine-hydrocortisone (ANAMANTEL HC) 3-0.5 % CREA Place 1 Applicatorful rectally 2 (two) times daily. (Patient not taking: Reported on 10/04/2016) 1 Tube 1   No facility-administered medications prior to visit.     ROS See HPI  Objective:  BP 134/78   Pulse (!) 49   Temp 97.8 F (36.6 C)   Ht 5\' 7"  (1.702 m)   Wt 161 lb (73 kg)   SpO2 99%   BMI 25.22 kg/m   BP Readings from Last 3 Encounters:  10/04/16 134/78  09/26/16 (!) 170/104  09/05/16 (!) 154/90    Wt Readings from Last 3 Encounters:  10/04/16 161 lb (73 kg)  09/26/16 161 lb (73 kg)  09/05/16 162 lb (73.5 kg)    Physical Exam  Constitutional: She is oriented to person, place, and time. No distress.  HENT:  Right Ear: External ear normal.  Left Ear: External ear normal.  Nose: Nose normal.  Mouth/Throat: No oropharyngeal exudate.  Eyes: No scleral icterus.  Neck: Normal range of motion. Neck supple.  Cardiovascular: Normal rate, regular rhythm and normal heart sounds.   Pulmonary/Chest: Effort normal and breath sounds normal. No respiratory distress.  Abdominal:  Soft. She exhibits no distension. There is no tenderness.  Musculoskeletal: Normal range of motion. She exhibits no edema.  Lymphadenopathy:    She has no cervical adenopathy.  Neurological: She is alert and oriented to person, place, and time.  Skin: Skin is warm and dry.  Psychiatric: She has a normal mood and affect. Her behavior is normal.    Lab Results  Component Value Date   WBC 4.7 08/24/2015   HGB 12.5 08/24/2015   HCT 38.3 08/24/2015   PLT 318 08/24/2015   GLUCOSE 107 (H) 10/04/2016   CHOL 222 (H) 10/04/2016   TRIG 82.0 10/04/2016   HDL 61.60 10/04/2016   LDLCALC 144 (H) 10/04/2016   ALT 11 10/04/2016   AST 12 10/04/2016   NA 142 10/04/2016   K 5.1 10/04/2016   CL 106 10/04/2016   CREATININE 0.82 10/04/2016   BUN 13 10/04/2016   CO2 33 (H) 10/04/2016   TSH 3.18 08/24/2015    No results found.  Assessment & Plan:   Cheryl Myers was seen today for follow-up.  Diagnoses and all orders for this visit:  HTN (hypertension), benign -     Comprehensive metabolic panel; Future  Encounter for lipid screening for cardiovascular disease -     Lipid panel; Future  Essential hypertension -     metoprolol succinate (  TOPROL-XL) 25 MG 24 hr tablet; Take 1 tablet (25 mg total) by mouth daily. Take with or immediately following a meal.  Need for diphtheria-tetanus-pertussis (Tdap) vaccine, adult/adolescent -     Tdap vaccine greater than or equal to 7yo IM   I have discontinued Cheryl Myers's lidocaine-hydrocortisone. I have also changed her metoprolol succinate. Additionally, I am having her maintain her Ibuprofen (ADVIL PO), Cholecalciferol (VITAMIN D PO), PARoxetine, and ALPRAZolam.  Meds ordered this encounter  Medications  . metoprolol succinate (TOPROL-XL) 25 MG 24 hr tablet    Sig: Take 1 tablet (25 mg total) by mouth daily. Take with or immediately following a meal.    Dispense:  90 tablet    Refill:  1    Order Specific Question:   Supervising Provider    Answer:    Cassandria Anger [[id number]]   Follow-up: Return in about 3 months (around 01/03/2017) for HTN.  Wilfred Lacy, NP

## 2016-10-07 ENCOUNTER — Telehealth: Payer: Self-pay | Admitting: Nurse Practitioner

## 2016-10-07 NOTE — Telephone Encounter (Signed)
Patient called back.  Gave patient Charlottes response on labs.

## 2016-11-05 ENCOUNTER — Encounter: Payer: Self-pay | Admitting: Nurse Practitioner

## 2016-11-05 ENCOUNTER — Ambulatory Visit (INDEPENDENT_AMBULATORY_CARE_PROVIDER_SITE_OTHER): Payer: Medicare Other | Admitting: Nurse Practitioner

## 2016-11-05 VITALS — BP 142/92 | HR 67 | Temp 98.2°F | Ht 67.0 in | Wt 162.0 lb

## 2016-11-05 DIAGNOSIS — J0140 Acute pansinusitis, unspecified: Secondary | ICD-10-CM | POA: Diagnosis not present

## 2016-11-05 MED ORDER — METHYLPREDNISOLONE ACETATE 40 MG/ML IJ SUSP
40.0000 mg | Freq: Once | INTRAMUSCULAR | Status: AC
  Administered 2016-11-05: 40 mg via INTRAMUSCULAR

## 2016-11-05 MED ORDER — FLUTICASONE PROPIONATE 50 MCG/ACT NA SUSP: 2.0000 | Freq: Every day | NASAL | 0 refills | Status: DC

## 2016-11-05 MED ORDER — AZITHROMYCIN 250 MG PO TABS: 250.0000 mg | ORAL_TABLET | Freq: Every day | ORAL | 0 refills | Status: DC

## 2016-11-05 MED ORDER — DM-GUAIFENESIN ER 30-600 MG PO TB12: 1.0000 | ORAL_TABLET | Freq: Two times a day (BID) | ORAL | 0 refills | Status: DC | PRN

## 2016-11-05 MED ORDER — OXYMETAZOLINE HCL 0.05 % NA SOLN: 1.0000 | Freq: Two times a day (BID) | NASAL | 0 refills | Status: DC

## 2016-11-05 NOTE — Progress Notes (Signed)
Subjective:  Patient ID: Cheryl Myers, female    DOB: Jun 14, 1946  Age: 71 y.o. MRN: [id number]  CC: Cough (congestion,sore thorat,ears stop up,coughing green mucus. )   Sinusitis  This is a new problem. The current episode started 1 to 4 weeks ago. The problem has been waxing and waning since onset. There has been no fever. The pain is moderate. Associated symptoms include chills, congestion, coughing, ear pain, headaches, a hoarse voice, sinus pressure, sneezing, a sore throat and swollen glands. Pertinent negatives include no shortness of breath. Past treatments include oral decongestants. The treatment provided no relief.    Outpatient Medications Prior to Visit  Medication Sig Dispense Refill  . ALPRAZolam (XANAX) 0.5 MG tablet TAKE 1 TABLET BY MOUTH AT BEDTIME AS NEEDED FOR ANXIETY 60 tablet 2  . Cholecalciferol (VITAMIN D PO) Take by mouth.    . Ibuprofen (ADVIL PO) Take by mouth.    . metoprolol succinate (TOPROL-XL) 25 MG 24 hr tablet Take 1 tablet (25 mg total) by mouth daily. Take with or immediately following a meal. 90 tablet 1  . PARoxetine (PAXIL) 20 MG tablet Take 1 tablet (20 mg total) by mouth daily. 90 tablet 3   No facility-administered medications prior to visit.     ROS See HPI  Objective:  BP (!) 142/92   Pulse 67   Temp 98.2 F (36.8 C)   Ht 5\' 7"  (1.702 m)   Wt 162 lb (73.5 kg)   SpO2 99%   BMI 25.37 kg/m   BP Readings from Last 3 Encounters:  11/05/16 (!) 142/92  10/04/16 134/78  09/26/16 (!) 170/104    Wt Readings from Last 3 Encounters:  11/05/16 162 lb (73.5 kg)  10/04/16 161 lb (73 kg)  09/26/16 161 lb (73 kg)    Physical Exam  Constitutional: She is oriented to person, place, and time.  HENT:  Right Ear: Tympanic membrane, external ear and ear canal normal.  Left Ear: Tympanic membrane, external ear and ear canal normal.  Nose: Mucosal edema and rhinorrhea present. Right sinus exhibits maxillary sinus tenderness and frontal sinus  tenderness. Left sinus exhibits maxillary sinus tenderness and frontal sinus tenderness.  Mouth/Throat: Uvula is midline. No trismus in the jaw. Posterior oropharyngeal erythema present. No oropharyngeal exudate.  Eyes: No scleral icterus.  Neck: Normal range of motion. Neck supple.  Cardiovascular: Normal rate and normal heart sounds.   Pulmonary/Chest: Effort normal and breath sounds normal.  Musculoskeletal: She exhibits no edema.  Lymphadenopathy:    She has no cervical adenopathy.  Neurological: She is alert and oriented to person, place, and time.  Vitals reviewed.   Lab Results  Component Value Date   WBC 4.7 08/24/2015   HGB 12.5 08/24/2015   HCT 38.3 08/24/2015   PLT 318 08/24/2015   GLUCOSE 107 (H) 10/04/2016   CHOL 222 (H) 10/04/2016   TRIG 82.0 10/04/2016   HDL 61.60 10/04/2016   LDLCALC 144 (H) 10/04/2016   ALT 11 10/04/2016   AST 12 10/04/2016   NA 142 10/04/2016   K 5.1 10/04/2016   CL 106 10/04/2016   CREATININE 0.82 10/04/2016   BUN 13 10/04/2016   CO2 33 (H) 10/04/2016   TSH 3.18 08/24/2015    No results found.  Assessment & Plan:   Cheryl Myers was seen today for cough.  Diagnoses and all orders for this visit:  Acute non-recurrent pansinusitis -     fluticasone (FLONASE) 50 MCG/ACT nasal spray; Place 2 sprays into  both nostrils daily. -     oxymetazoline (AFRIN NASAL SPRAY) 0.05 % nasal spray; Place 1 spray into both nostrils 2 (two) times daily. Use only for 3days, then stop -     dextromethorphan-guaiFENesin (MUCINEX DM) 30-600 MG 12hr tablet; Take 1 tablet by mouth 2 (two) times daily as needed for cough. -     methylPREDNISolone acetate (DEPO-MEDROL) injection 40 mg; Inject 1 mL (40 mg total) into the muscle once. -     azithromycin (ZITHROMAX Z-PAK) 250 MG tablet; Take 1 tablet (250 mg total) by mouth daily. Take 2tabs on first day, then 1tab once a day till complete   I am having Cheryl Myers start on fluticasone, oxymetazoline,  dextromethorphan-guaiFENesin, and azithromycin. I am also having her maintain her Ibuprofen (ADVIL PO), Cholecalciferol (VITAMIN D PO), PARoxetine, ALPRAZolam, and metoprolol succinate. We administered methylPREDNISolone acetate.  Meds ordered this encounter  Medications  . fluticasone (FLONASE) 50 MCG/ACT nasal spray    Sig: Place 2 sprays into both nostrils daily.    Dispense:  16 g    Refill:  0    Order Specific Question:   Supervising Provider    Answer:   Cassandria Anger [[id number]]  . oxymetazoline (AFRIN NASAL SPRAY) 0.05 % nasal spray    Sig: Place 1 spray into both nostrils 2 (two) times daily. Use only for 3days, then stop    Dispense:  30 mL    Refill:  0    Order Specific Question:   Supervising Provider    Answer:   Cassandria Anger [[id number]]  . dextromethorphan-guaiFENesin (MUCINEX DM) 30-600 MG 12hr tablet    Sig: Take 1 tablet by mouth 2 (two) times daily as needed for cough.    Dispense:  14 tablet    Refill:  0    Order Specific Question:   Supervising Provider    Answer:   Cassandria Anger [[id number]]  . methylPREDNISolone acetate (DEPO-MEDROL) injection 40 mg  . azithromycin (ZITHROMAX Z-PAK) 250 MG tablet    Sig: Take 1 tablet (250 mg total) by mouth daily. Take 2tabs on first day, then 1tab once a day till complete    Dispense:  6 tablet    Refill:  0    Order Specific Question:   Supervising Provider    Answer:   Cassandria Anger [[id number]]    Follow-up: Return if symptoms worsen or fail to improve.  Wilfred Lacy, NP

## 2016-11-05 NOTE — Patient Instructions (Signed)

## 2016-11-05 NOTE — Progress Notes (Signed)
Pre visit review using our clinic review tool, if applicable. No additional management support is needed unless otherwise documented below in the visit note. 

## 2016-11-13 ENCOUNTER — Telehealth: Payer: Self-pay | Admitting: *Deleted

## 2016-11-13 MED ORDER — PAROXETINE HCL 20 MG PO TABS: 20.0000 mg | ORAL_TABLET | Freq: Every day | ORAL | 3 refills | Status: DC

## 2016-11-13 NOTE — Telephone Encounter (Signed)
Pt has new pharmacy wants Paxil 20 mg tablets sent to another pharmacy, pt was prescribed on 09/05/16. Rx sent.

## 2017-01-08 ENCOUNTER — Ambulatory Visit: Payer: Medicare Other | Admitting: Nurse Practitioner

## 2017-01-23 DIAGNOSIS — L570 Actinic keratosis: Secondary | ICD-10-CM | POA: Diagnosis not present

## 2017-01-23 DIAGNOSIS — Z85828 Personal history of other malignant neoplasm of skin: Secondary | ICD-10-CM | POA: Diagnosis not present

## 2017-01-23 DIAGNOSIS — Z08 Encounter for follow-up examination after completed treatment for malignant neoplasm: Secondary | ICD-10-CM | POA: Diagnosis not present

## 2017-01-23 DIAGNOSIS — L821 Other seborrheic keratosis: Secondary | ICD-10-CM | POA: Diagnosis not present

## 2017-01-23 DIAGNOSIS — L578 Other skin changes due to chronic exposure to nonionizing radiation: Secondary | ICD-10-CM | POA: Diagnosis not present

## 2017-03-04 ENCOUNTER — Other Ambulatory Visit: Payer: Self-pay | Admitting: Gynecology

## 2017-03-04 DIAGNOSIS — Z23 Encounter for immunization: Secondary | ICD-10-CM | POA: Diagnosis not present

## 2017-03-04 DIAGNOSIS — R7309 Other abnormal glucose: Secondary | ICD-10-CM | POA: Diagnosis not present

## 2017-03-04 DIAGNOSIS — H6123 Impacted cerumen, bilateral: Secondary | ICD-10-CM | POA: Diagnosis not present

## 2017-03-04 DIAGNOSIS — I10 Essential (primary) hypertension: Secondary | ICD-10-CM | POA: Diagnosis not present

## 2017-03-04 DIAGNOSIS — F458 Other somatoform disorders: Secondary | ICD-10-CM | POA: Diagnosis not present

## 2017-03-04 DIAGNOSIS — E785 Hyperlipidemia, unspecified: Secondary | ICD-10-CM | POA: Diagnosis not present

## 2017-03-04 DIAGNOSIS — R5383 Other fatigue: Secondary | ICD-10-CM | POA: Diagnosis not present

## 2017-03-04 NOTE — Telephone Encounter (Signed)
Called into pharmacy

## 2017-03-05 DIAGNOSIS — E782 Mixed hyperlipidemia: Secondary | ICD-10-CM | POA: Insufficient documentation

## 2017-03-05 DIAGNOSIS — H6123 Impacted cerumen, bilateral: Secondary | ICD-10-CM | POA: Insufficient documentation

## 2017-03-05 DIAGNOSIS — R7309 Other abnormal glucose: Secondary | ICD-10-CM | POA: Insufficient documentation

## 2017-03-05 DIAGNOSIS — R5383 Other fatigue: Secondary | ICD-10-CM | POA: Insufficient documentation

## 2017-03-05 DIAGNOSIS — E785 Hyperlipidemia, unspecified: Secondary | ICD-10-CM | POA: Insufficient documentation

## 2017-03-05 DIAGNOSIS — F458 Other somatoform disorders: Secondary | ICD-10-CM | POA: Insufficient documentation

## 2017-03-07 ENCOUNTER — Encounter: Payer: Self-pay | Admitting: Gynecology

## 2017-03-07 DIAGNOSIS — Z1231 Encounter for screening mammogram for malignant neoplasm of breast: Secondary | ICD-10-CM | POA: Diagnosis not present

## 2017-06-17 DIAGNOSIS — Z5329 Procedure and treatment not carried out because of patient's decision for other reasons: Secondary | ICD-10-CM | POA: Diagnosis not present

## 2017-06-17 DIAGNOSIS — R4689 Other symptoms and signs involving appearance and behavior: Secondary | ICD-10-CM | POA: Diagnosis not present

## 2017-06-17 DIAGNOSIS — F329 Major depressive disorder, single episode, unspecified: Secondary | ICD-10-CM | POA: Diagnosis not present

## 2017-06-18 DIAGNOSIS — R4589 Other symptoms and signs involving emotional state: Secondary | ICD-10-CM | POA: Insufficient documentation

## 2017-07-09 DIAGNOSIS — G4700 Insomnia, unspecified: Secondary | ICD-10-CM | POA: Insufficient documentation

## 2017-07-09 DIAGNOSIS — F419 Anxiety disorder, unspecified: Secondary | ICD-10-CM | POA: Diagnosis not present

## 2017-07-25 DIAGNOSIS — D1801 Hemangioma of skin and subcutaneous tissue: Secondary | ICD-10-CM | POA: Diagnosis not present

## 2017-07-25 DIAGNOSIS — Z85828 Personal history of other malignant neoplasm of skin: Secondary | ICD-10-CM | POA: Diagnosis not present

## 2017-07-25 DIAGNOSIS — Z08 Encounter for follow-up examination after completed treatment for malignant neoplasm: Secondary | ICD-10-CM | POA: Diagnosis not present

## 2017-07-25 DIAGNOSIS — L820 Inflamed seborrheic keratosis: Secondary | ICD-10-CM | POA: Diagnosis not present

## 2017-07-25 DIAGNOSIS — L821 Other seborrheic keratosis: Secondary | ICD-10-CM | POA: Diagnosis not present

## 2017-07-25 DIAGNOSIS — L570 Actinic keratosis: Secondary | ICD-10-CM | POA: Diagnosis not present

## 2017-08-21 DIAGNOSIS — Z0000 Encounter for general adult medical examination without abnormal findings: Secondary | ICD-10-CM | POA: Diagnosis not present

## 2017-08-22 DIAGNOSIS — Z09 Encounter for follow-up examination after completed treatment for conditions other than malignant neoplasm: Secondary | ICD-10-CM | POA: Insufficient documentation

## 2017-12-24 IMAGING — NM NM MISC PROCEDURE
8 series · 48 of 48 positions shown · non-contrast
Comparison: none

[Series 1: wbr_r-proj_st rest · 6.51mm/px · 6 of 64 frames shown (1 of 2)]
[frame 6/64]
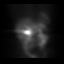
[frame 16/64]
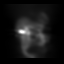
[frame 27/64]
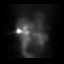
[frame 38/64]
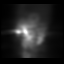
[frame 48/64]
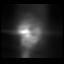
[frame 59/64]
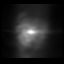

[Series 1: rest · 6.51mm/px · 6 of 64 frames shown (1 of 2)]
[frame 6/64]
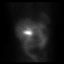
[frame 16/64]
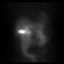
[frame 27/64]
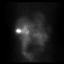
[frame 38/64]
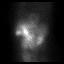
[frame 48/64]
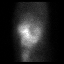
[frame 59/64]
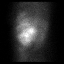

[Series 2: wbr_r-proj_st rest · 6.51mm/px · 6 of 64 frames shown (2 of 2)]
[frame 6/64]
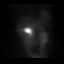
[frame 16/64]
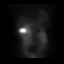
[frame 27/64]
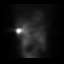
[frame 38/64]
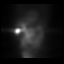
[frame 48/64]
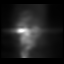
[frame 59/64]
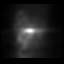

[Series 2: rest · 6.51mm/px · 6 of 64 frames shown (2 of 2)]
[frame 6/64]
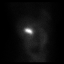
[frame 16/64]
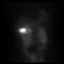
[frame 27/64]
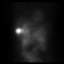
[frame 38/64]
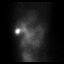
[frame 48/64]
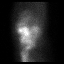
[frame 59/64]
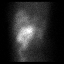

[Series 3: wbr_s-proj_st stress · 6.51mm/px · 6 of 64 frames shown (1 of 2)]
[frame 6/64]
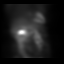
[frame 16/64]
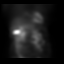
[frame 27/64]
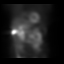
[frame 38/64]
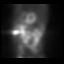
[frame 48/64]
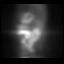
[frame 59/64]
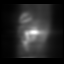

[Series 3: wbr_s-proj_st stress · 6.51mm/px · 6 of 512 frames shown (2 of 2)]
[frame 43/512]
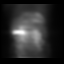
[frame 128/512]
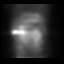
[frame 214/512]
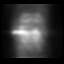
[frame 299/512]
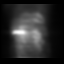
[frame 384/512]
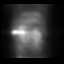
[frame 470/512]
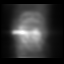

[Series 3: stress · 6.51mm/px · 6 of 64 frames shown (1 of 2)]
[frame 6/64]
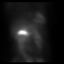
[frame 16/64]
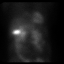
[frame 27/64]
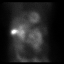
[frame 38/64]
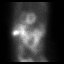
[frame 48/64]
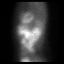
[frame 59/64]
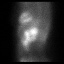

[Series 3: stress · 6.51mm/px · 6 of 512 frames shown (2 of 2)]
[frame 43/512]
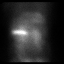
[frame 128/512]
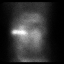
[frame 214/512]
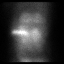
[frame 299/512]
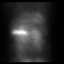
[frame 384/512]
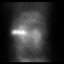
[frame 470/512]
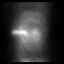

[48 of 48 positions shown; findings below may reference images not displayed]

Canned report from images found in remote index.

Refer to host system for actual result text.

## 2018-01-02 ENCOUNTER — Telehealth: Payer: Self-pay | Admitting: *Deleted

## 2018-01-02 MED ORDER — ALPRAZOLAM 0.5 MG PO TABS: ORAL_TABLET | ORAL | 0 refills | Status: DC

## 2018-01-02 NOTE — Telephone Encounter (Signed)
Pt aware, Rx sent. 

## 2018-01-02 NOTE — Telephone Encounter (Signed)
Okay for #30 refill

## 2018-01-02 NOTE — Telephone Encounter (Signed)
Rx called in 

## 2018-01-02 NOTE — Telephone Encounter (Signed)
Patient has annual scheduled on 02/25/18, needs refill on Xanax 0.5 mg tablet, per note "Uses Xanax 0.5 mg intermittently for sleep and anxiety"  Please advise

## 2018-01-17 DIAGNOSIS — R042 Hemoptysis: Secondary | ICD-10-CM | POA: Diagnosis not present

## 2018-01-17 DIAGNOSIS — R0602 Shortness of breath: Secondary | ICD-10-CM | POA: Insufficient documentation

## 2018-01-17 DIAGNOSIS — R05 Cough: Secondary | ICD-10-CM | POA: Insufficient documentation

## 2018-01-17 DIAGNOSIS — I10 Essential (primary) hypertension: Secondary | ICD-10-CM | POA: Diagnosis not present

## 2018-01-17 DIAGNOSIS — R509 Fever, unspecified: Secondary | ICD-10-CM | POA: Diagnosis not present

## 2018-01-17 DIAGNOSIS — Z79899 Other long term (current) drug therapy: Secondary | ICD-10-CM | POA: Insufficient documentation

## 2018-01-17 DIAGNOSIS — R51 Headache: Secondary | ICD-10-CM | POA: Diagnosis not present

## 2018-01-17 DIAGNOSIS — J324 Chronic pansinusitis: Secondary | ICD-10-CM | POA: Diagnosis not present

## 2018-01-18 ENCOUNTER — Encounter (HOSPITAL_COMMUNITY): Payer: Self-pay | Admitting: *Deleted

## 2018-01-18 ENCOUNTER — Other Ambulatory Visit: Payer: Self-pay

## 2018-01-18 ENCOUNTER — Emergency Department (HOSPITAL_COMMUNITY): Payer: Medicare Other

## 2018-01-18 ENCOUNTER — Emergency Department (HOSPITAL_COMMUNITY)
Admission: EM | Admit: 2018-01-18 | Discharge: 2018-01-18 | Disposition: A | Discharge status: Home / Self Care | Payer: Medicare Other | Attending: Emergency Medicine | Admitting: Emergency Medicine

## 2018-01-18 DIAGNOSIS — R05 Cough: Secondary | ICD-10-CM | POA: Diagnosis not present

## 2018-01-18 DIAGNOSIS — R042 Hemoptysis: Secondary | ICD-10-CM | POA: Diagnosis not present

## 2018-01-18 LAB — BASIC METABOLIC PANEL
ANION GAP: 9 (ref 5–15)
BUN: 8 mg/dL (ref 8–23)
CHLORIDE: 102 mmol/L (ref 98–111)
CO2: 29 mmol/L (ref 22–32)
Calcium: 9.4 mg/dL (ref 8.9–10.3)
Creatinine, Ser: 0.74 mg/dL (ref 0.44–1.00)
GFR calc Af Amer: >60 mL/min (ref >60)
GLUCOSE: 151 mg/dL — AB (ref 70–99)
POTASSIUM: 3.9 mmol/L (ref 3.5–5.1)
Sodium: 140 mmol/L (ref 135–145)

## 2018-01-18 LAB — CBC WITH DIFFERENTIAL/PLATELET
BASOS PCT: 1 %
Basophils Absolute: 0 10*3/uL (ref 0.0–0.1)
Eosinophils Absolute: 0.1 10*3/uL (ref 0.0–0.7)
Eosinophils Relative: 3 %
HEMATOCRIT: 39.1 % (ref 36.0–46.0)
HEMOGLOBIN: 13 g/dL (ref 12.0–15.0)
LYMPHS ABS: 0.8 10*3/uL (ref 0.7–4.0)
Lymphocytes Relative: 18 %
MCH: 30.8 pg (ref 26.0–34.0)
MCHC: 33.2 g/dL (ref 30.0–36.0)
MCV: 92.7 fL (ref 78.0–100.0)
MONOS PCT: 13 %
Monocytes Absolute: 0.5 10*3/uL (ref 0.1–1.0)
NEUTROS ABS: 2.8 10*3/uL (ref 1.7–7.7)
Neutrophils Relative %: 65 %
Platelets: 261 10*3/uL (ref 150–400)
RBC: 4.22 MIL/uL (ref 3.87–5.11)
RDW: 13.9 % (ref 11.5–15.5)
WBC: 4.3 10*3/uL (ref 4.0–10.5)

## 2018-01-18 LAB — D-DIMER, QUANTITATIVE (NOT AT ARMC): D DIMER QUANT: 0.67 ug{FEU}/mL — AB (ref 0.00–0.50)

## 2018-01-18 MED ORDER — ALBUTEROL SULFATE (2.5 MG/3ML) 0.083% IN NEBU
5.0000 mg | INHALATION_SOLUTION | Freq: Once | RESPIRATORY_TRACT | Status: AC
  Administered 2018-01-18: 5 mg via RESPIRATORY_TRACT
  Filled 2018-01-18: qty 6

## 2018-01-18 MED ORDER — DIPHENHYDRAMINE HCL 50 MG/ML IJ SOLN
25.0000 mg | Freq: Once | INTRAMUSCULAR | Status: AC
  Administered 2018-01-18: 25 mg via INTRAVENOUS
  Filled 2018-01-18: qty 1

## 2018-01-18 MED ORDER — SODIUM CHLORIDE 0.9 % IV BOLUS (SEPSIS)
500.0000 mL | Freq: Once | INTRAVENOUS | Status: AC
  Administered 2018-01-18: 500 mL via INTRAVENOUS

## 2018-01-18 MED ORDER — METOCLOPRAMIDE HCL 5 MG/ML IJ SOLN
10.0000 mg | Freq: Once | INTRAMUSCULAR | Status: AC
  Administered 2018-01-18: 10 mg via INTRAVENOUS
  Filled 2018-01-18: qty 2

## 2018-01-18 NOTE — ED Triage Notes (Signed)
Pt reports that she felt like she had a sinus infection for about a week. She was seen and given abx and tessalon pearls for cough. States that she started coughing up blood approx 2 hours ago, she has had congestion and headache. Last tylenol at 2230, ibuprofen at 1630.

## 2018-01-18 NOTE — ED Provider Notes (Signed)
[facility] DEPT Provider Note   CSN: [id number] Arrival date & time: 01/17/18  2347     History   Chief Complaint Chief Complaint  Patient presents with  . Hemoptysis    HPI Cheryl Myers is a 72 y.o. female.  The history is provided by the patient, the spouse and a relative.  Cough  This is a new problem. The current episode started more than 2 days ago. The problem occurs every few minutes. The problem has been gradually worsening. Associated symptoms include ear congestion, headaches and shortness of breath. Pertinent negatives include no chest pain. Treatments tried: cefdinir. The treatment provided no relief.  Patient history of hypertension presents with multiple complaints She reports several days ago she began having cough, nasal and sinus congestion, facial pain and dental pain.  She then developed headaches, which is similar to prior sinus infections.  She has been seen in outside urgent care at that time she had a fever and was placed on Omnicef Today she coughed up blood.  She also reports blowing her nose and having blood and nasal discharge. No chest pain.  She reports chronic shortness of breath.  No history of COPD.  She is a non-smoker.  No history of VTE  Past Medical History:  Diagnosis Date  . Anxiety   . Cancer (HCC)    Basal and squamous cell skin cancer  . Hypertension   . Osteopenia 08/2014   T score -1.9 FRAX 11%/1.8%    Patient Active Problem List   Diagnosis Date Noted  . HTN (hypertension), benign 09/26/2016  . Anxiety 09/26/2016  . Essential hypertension 09/26/2016    Past Surgical History:  Procedure Laterality Date  . Basal and squamous cell skin cancers excised        OB History    Gravida  2   Para  2   Term  2   Preterm      AB      Living  2     SAB      TAB      Ectopic      Multiple      Live Births               Home Medications    Prior to Admission medications     Medication Sig Start Date End Date Taking? Authorizing Provider  ALPRAZolam (XANAX) 0.5 MG tablet TAKE 1 TABLET BY MOUTH EACH NIGHT AT BEDTIME AS NEEDED FOR ANXIETY 01/02/18   Fontaine, Belinda Block, MD  azithromycin (ZITHROMAX Z-PAK) 250 MG tablet Take 1 tablet (250 mg total) by mouth daily. Take 2tabs on first day, then 1tab once a day till complete 11/05/16   Nche, Charlene Brooke, NP  Cholecalciferol (VITAMIN D PO) Take by mouth.    [provider]  dextromethorphan-guaiFENesin (MUCINEX DM) 30-600 MG 12hr tablet Take 1 tablet by mouth 2 (two) times daily as needed for cough. 11/05/16   Nche, Charlene Brooke, NP  fluticasone (FLONASE) 50 MCG/ACT nasal spray Place 2 sprays into both nostrils daily. 11/05/16   Nche, Charlene Brooke, NP  Ibuprofen (ADVIL PO) Take by mouth.    [provider]  metoprolol succinate (TOPROL-XL) 25 MG 24 hr tablet Take 1 tablet (25 mg total) by mouth daily. Take with or immediately following a meal. 10/04/16   Nche, Charlene Brooke, NP  oxymetazoline (AFRIN NASAL SPRAY) 0.05 % nasal spray Place 1 spray into both nostrils 2 (two) times daily. Use only  for 3days, then stop 11/05/16   Nche, Charlene Brooke, NP  PARoxetine (PAXIL) 20 MG tablet Take 1 tablet (20 mg total) by mouth daily. 11/13/16   Fontaine, Belinda Block, MD    Family History Family History  Problem Relation Age of Onset  . Diabetes Mother   . Alzheimer's disease Mother   . Melanoma Brother   . Diabetes Maternal Aunt   . Melanoma Maternal Uncle   . Diabetes Maternal Uncle   . Heart disease Maternal Uncle   . Melanoma Maternal Uncle   . Heart disease Maternal Uncle     Social History Social History   Tobacco Use  . Smoking status: Never Smoker  . Smokeless tobacco: Never Used  Substance Use Topics  . Alcohol use: Yes    Alcohol/week: 0.0 oz    Comment: Occas.  . Drug use: No     Allergies   Patient has no known allergies.   Review of Systems Review of Systems  Constitutional: Positive for  fever.  Respiratory: Positive for cough and shortness of breath.   Cardiovascular: Negative for chest pain.  Neurological: Positive for headaches.  All other systems reviewed and are negative.    Physical Exam Updated Vital Signs BP (!) 162/106 (BP Location: Right Arm)   Pulse 98   Temp 98.3 F (36.8 C) (Oral)   Resp 18   SpO2 99%   Physical Exam CONSTITUTIONAL: Well developed/well nourished HEAD: Normocephalic/atraumatic EYES: EOMI/PERRL ENMT: Mucous membranes moist, no blood in either nare.  No blood in oropharynx.  No stridor. NECK: supple no meningeal signs SPINE/BACK:entire spine nontender CV: S1/S2 noted, no murmurs/rubs/gallops noted LUNGS: Coughs frequently during exam, lungs otherwise clear ABDOMEN: soft, nontender, no rebound or guarding, bowel sounds noted throughout abdomen GU:no cva tenderness NEURO: Pt is awake/alert/appropriate, moves all extremitiesx4.  No facial droop.  No arm or leg drift EXTREMITIES: pulses normal/equal, full ROM SKIN: warm, color normal PSYCH: no abnormalities of mood noted, alert and oriented to situation   ED Treatments / Results  Labs (all labs ordered are listed, but only abnormal results are displayed) Labs Reviewed  BASIC METABOLIC PANEL - Abnormal; Notable for the following components:      Result Value   Glucose, Bld 151 (*)    All other components within normal limits  D-DIMER, QUANTITATIVE (NOT AT [facility]) - Abnormal; Notable for the following components:   D-Dimer, Quant 0.67 (*)    All other components within normal limits  CBC WITH DIFFERENTIAL/PLATELET    EKG None  Radiology Dg Chest 2 View  Result Date: 01/18/2018 CLINICAL DATA:  Sinus infection x1 week with cough. EXAM: CHEST - 2 VIEW COMPARISON:  None. FINDINGS: The heart size and mediastinal contours are within normal limits. Mild aortic atherosclerosis at the arch without aneurysm. Both lungs are clear. Mild degenerative change of the upper thoracic spine. No  acute osseous abnormality. IMPRESSION: No active cardiopulmonary disease.  Minimal aortic atherosclerosis. Electronically Signed   By: Ashley Royalty M.D.   On: 01/18/2018 01:35    Procedures Procedures   Medications Ordered in ED Medications  sodium chloride 0.9 % bolus 500 mL (500 mLs Intravenous New Bag/Given 01/18/18 0404)  albuterol (PROVENTIL) (2.5 MG/3ML) 0.083% nebulizer solution 5 mg (5 mg Nebulization Given 01/18/18 0240)  metoCLOPramide (REGLAN) injection 10 mg (10 mg Intravenous Given 01/18/18 0239)  diphenhydrAMINE (BENADRYL) injection 25 mg (25 mg Intravenous Given 01/18/18 0240)     Initial Impression / Assessment and Plan / ED Course  I  have reviewed the triage vital signs and the nursing notes.  Pertinent labs & imaging results that were available during my care of the patient were reviewed by me and considered in my medical decision making (see chart for details).     2:35 AM Patient with recent sinusitis now with coughing and hemoptysis. Labs and imaging reviewed.  Medications ordered for headache.  Suspect headache is due to high blood pressure as well as sinusitis, no signs of acute stroke or other acute neurologic emergency. 4:30 AM Patient feels improved.  She reports headaches improved.  She is ambulatory without any difficulty.  No hypoxia on ambulation. She is low risk for PE, Wells score at 1 and her age-adjusted d-dimer is negative.  My suspicion for VTE is low.  Suspicious that sinusitis triggered her symptoms including bloody nasal discharge and hemoptysis.  Patient feels improved for discharge.  She will continue her antibiotics.  Also suggest that she restart her home hypertension medicines Final Clinical Impressions(s) / ED Diagnoses   Final diagnoses:  Hemoptysis    ED Discharge Orders    None       Ripley Fraise, MD 01/18/18 [phone]

## 2018-01-18 NOTE — ED Notes (Signed)
Ambulated Pt down hallway. Pt's oxygen saturation stayed at 97% on room air. Pt denies any shortness of breath

## 2018-01-27 DIAGNOSIS — L570 Actinic keratosis: Secondary | ICD-10-CM | POA: Diagnosis not present

## 2018-01-27 DIAGNOSIS — Z85828 Personal history of other malignant neoplasm of skin: Secondary | ICD-10-CM | POA: Diagnosis not present

## 2018-01-27 DIAGNOSIS — L578 Other skin changes due to chronic exposure to nonionizing radiation: Secondary | ICD-10-CM | POA: Diagnosis not present

## 2018-01-27 DIAGNOSIS — L820 Inflamed seborrheic keratosis: Secondary | ICD-10-CM | POA: Diagnosis not present

## 2018-01-27 DIAGNOSIS — D1801 Hemangioma of skin and subcutaneous tissue: Secondary | ICD-10-CM | POA: Diagnosis not present

## 2018-01-27 DIAGNOSIS — L821 Other seborrheic keratosis: Secondary | ICD-10-CM | POA: Diagnosis not present

## 2018-02-25 ENCOUNTER — Ambulatory Visit (INDEPENDENT_AMBULATORY_CARE_PROVIDER_SITE_OTHER): Payer: Medicare Other | Admitting: Gynecology

## 2018-02-25 ENCOUNTER — Encounter: Payer: Self-pay | Admitting: Gynecology

## 2018-02-25 VITALS — BP 140/84 | Ht 67.0 in | Wt 178.0 lb

## 2018-02-25 DIAGNOSIS — Z01419 Encounter for gynecological examination (general) (routine) without abnormal findings: Secondary | ICD-10-CM | POA: Diagnosis not present

## 2018-02-25 DIAGNOSIS — N952 Postmenopausal atrophic vaginitis: Secondary | ICD-10-CM

## 2018-02-25 DIAGNOSIS — M8580 Other specified disorders of bone density and structure, unspecified site: Secondary | ICD-10-CM

## 2018-02-25 MED ORDER — PAROXETINE HCL 20 MG PO TABS: 20.0000 mg | ORAL_TABLET | Freq: Every day | ORAL | 3 refills | Status: DC

## 2018-02-25 MED ORDER — ALPRAZOLAM 0.5 MG PO TABS: ORAL_TABLET | ORAL | 4 refills | Status: DC

## 2018-02-25 NOTE — Progress Notes (Signed)
    Cheryl Myers 06-19-1946 [id number]        72 y.o.  G2P2002 for breast and pelvic exam.  Past medical history,surgical history, problem list, medications, allergies, family history and social history were all reviewed and documented as reviewed in the EPIC chart.  ROS:  Performed with pertinent positives and negatives included in the history, assessment and plan.   Additional significant findings : None   Exam: Caryn Bee assistant Vitals:   02/25/18 1133  BP: 140/84  Weight: 178 lb (80.7 kg)  Height: 5\' 7"  (1.702 m)   Body mass index is 27.88 kg/m.  General appearance:  Normal affect, orientation and appearance. Skin: Grossly normal HEENT: Without gross lesions.  No cervical or supraclavicular adenopathy. Thyroid normal.  Lungs:  Clear without wheezing, rales or rhonchi Cardiac: RR, without RMG Abdominal:  Soft, nontender, without masses, guarding, rebound, organomegaly or hernia Breasts:  Examined lying and sitting without masses, retractions, discharge or axillary adenopathy. Pelvic:  Ext, BUS, Vagina: With atrophic changes  Cervix: With atrophic changes  Uterus: Anteverted, normal size, shape and contour, midline and mobile nontender   Adnexa: Without masses or tenderness    Anus and perineum: Normal   Rectovaginal: Normal sphincter tone without palpated masses or tenderness.    Assessment/Plan:  72 y.o. [id number] female for breast and pelvic exam.   1. Postmenopausal.  No significant menopausal symptoms or any vaginal bleeding. 2. Osteopenia.  DEXA 2016 T score -1.9.  FRAX 11% / 1.8%.  Was to schedule DEXA last year but never followed up for this.  Recommend scheduling DEXA now and she agrees to do so this fall. 3. Mammography coming due in September and I reminded her to schedule this.  Breast exam normal today. 4. Colonoscopy 2016.  Repeat at their recommended interval. 5. Pap smear 2018.  No Pap smear done today.  No history of abnormal Pap smears previously.   Options to stop screening per current screening guidelines.  Will readdress on annual basis. 6. Anxiety.  Uses Paxil 20 mg daily doing well on this without significant side effects.  Intermittently uses Xanax 0.5 mg for sleep and anxiety.  Son with end-stage liver disease awaiting transplant.  Son-in-law with colon cancer on chemotherapy.  Refill for both provided. 7. Health maintenance.  Blood pressure borderline at 140/84.  Was seen and started on blood pressure medication by a primary physician but she discontinued this due to side effects.  I recommend that she follow-up with primary care for ongoing monitoring and treatment as needed.  No lab work done as this will be done through their office.  Follow-up in 1 year, sooner as needed.   Anastasio Auerbach MD, 12:02 PM 02/25/2018

## 2018-02-25 NOTE — Patient Instructions (Addendum)
Follow-up for the bone density as scheduled.  Arrange an appointment to see a primary physician to follow-up and monitor your blood pressure.

## 2018-05-07 ENCOUNTER — Encounter: Payer: Self-pay | Admitting: Gynecology

## 2018-05-13 DIAGNOSIS — Z1231 Encounter for screening mammogram for malignant neoplasm of breast: Secondary | ICD-10-CM | POA: Diagnosis not present

## 2018-08-11 ENCOUNTER — Other Ambulatory Visit: Payer: Self-pay | Admitting: Gynecology

## 2018-10-16 ENCOUNTER — Encounter: Payer: Self-pay | Admitting: Gynecology

## 2018-10-16 ENCOUNTER — Other Ambulatory Visit: Payer: Self-pay

## 2018-10-16 ENCOUNTER — Telehealth: Payer: Self-pay | Admitting: *Deleted

## 2018-10-16 ENCOUNTER — Ambulatory Visit (INDEPENDENT_AMBULATORY_CARE_PROVIDER_SITE_OTHER): Payer: Medicare Other | Admitting: Gynecology

## 2018-10-16 DIAGNOSIS — N3000 Acute cystitis without hematuria: Secondary | ICD-10-CM | POA: Diagnosis not present

## 2018-10-16 MED ORDER — SULFAMETHOXAZOLE-TRIMETHOPRIM 800-160 MG PO TABS: 1.0000 | ORAL_TABLET | Freq: Two times a day (BID) | ORAL | 0 refills | Status: DC

## 2018-10-16 NOTE — Patient Instructions (Signed)
Take the prescribed antibiotic twice daily for 3 days.  Follow-up if your symptoms persist, worsen or recur.

## 2018-10-16 NOTE — Progress Notes (Signed)
    Cheryl Myers 02/26/1946 [id number]        73 y.o.  G2P2002 patient calls for a telemedicine encounter.  She is calling from home and I am in my office.  She is identified by 2 identifiers.  She understands and accepts the limitations of a telemedicine encounter.  She is complaining of several days of worsening dysuria, frequency, suprapubic pressure and not feeling like she is emptying her bladder when voiding.  No low back pain.  No fever or chills.  No diarrhea/constipation.  No vaginal odor, discharge or irritation.  History of UTI in the past with similar symptoms.  Past medical history,surgical history, problem list, medications, allergies, family history and social history were all reviewed and documented in the EPIC chart.  Directed ROS with pertinent positives and negatives documented in the history of present illness/assessment and plan.  Exam:  Assessment/Plan:  73 y.o. [id number] with classic UTI symptoms.  Will treat with Septra DS 1 p.o. twice daily x3 days.  She will follow-up if her symptoms persist, worsen or recur.  12 minutes of time was taken in direct patient communication and review of her records as well as prescribing her medication.    Cheryl Auerbach MD, 9:52 AM 10/16/2018

## 2018-10-16 NOTE — Telephone Encounter (Signed)
Patient called and left message in triage voicemail c/o UTI symptoms burning with urination and after urination. I called patient this am to tell her we can schedule virtual office visit this am with provider. Patient husband picked up and said patient was sleep, I told him to let her know we can schedule virtual visit once she wakes up, but she need to call in am because the office will be closing early. Patient husband said he will relay.

## 2018-11-07 ENCOUNTER — Other Ambulatory Visit: Payer: Self-pay | Admitting: Gynecology

## 2019-03-02 ENCOUNTER — Other Ambulatory Visit: Payer: Self-pay

## 2019-03-03 ENCOUNTER — Ambulatory Visit (INDEPENDENT_AMBULATORY_CARE_PROVIDER_SITE_OTHER): Payer: Medicare Other | Admitting: Gynecology

## 2019-03-03 ENCOUNTER — Encounter: Payer: Self-pay | Admitting: Gynecology

## 2019-03-03 VITALS — BP 122/76 | Ht 67.0 in | Wt 171.0 lb

## 2019-03-03 DIAGNOSIS — Z01419 Encounter for gynecological examination (general) (routine) without abnormal findings: Secondary | ICD-10-CM

## 2019-03-03 DIAGNOSIS — N952 Postmenopausal atrophic vaginitis: Secondary | ICD-10-CM

## 2019-03-03 DIAGNOSIS — F419 Anxiety disorder, unspecified: Secondary | ICD-10-CM

## 2019-03-03 DIAGNOSIS — M8580 Other specified disorders of bone density and structure, unspecified site: Secondary | ICD-10-CM

## 2019-03-03 MED ORDER — ALPRAZOLAM 0.5 MG PO TABS: ORAL_TABLET | ORAL | 5 refills | Status: DC

## 2019-03-03 MED ORDER — PAROXETINE HCL 20 MG PO TABS: 20.0000 mg | ORAL_TABLET | Freq: Every day | ORAL | 4 refills | Status: DC

## 2019-03-03 NOTE — Patient Instructions (Signed)
Follow-up for the bone density as scheduled. 

## 2019-03-03 NOTE — Progress Notes (Signed)
    Cheryl Myers [birth year] [id number]        72 y.o.  G2P2002 for breast and pelvic exam.  Past medical history,surgical history, problem list, medications, allergies, family history and social history were all reviewed and documented as reviewed in the EPIC chart.  ROS:  Performed with pertinent positives and negatives included in the history, assessment and plan.   Additional significant findings : None   Exam: Caryn Bee assistant Vitals:   03/03/19 1536  BP: 122/76  Weight: 171 lb (77.6 kg)  Height: 5\' 7"  (1.702 m)   Body mass index is 26.78 kg/m.  General appearance:  Normal affect, orientation and appearance. Skin: Grossly normal HEENT: Without gross lesions.  No cervical or supraclavicular adenopathy. Thyroid normal.  Lungs:  Clear without wheezing, rales or rhonchi Cardiac: RR, without RMG Abdominal:  Soft, nontender, without masses, guarding, rebound, organomegaly or hernia Breasts:  Examined lying and sitting without masses, retractions, discharge or axillary adenopathy. Pelvic:  Ext, BUS, Vagina: With atrophic changes  Cervix: With atrophic changes  Uterus: Anteverted, normal size, shape and contour, midline and mobile nontender   Adnexa: Without masses or tenderness    Anus and perineum: Normal   Rectovaginal: Normal sphincter tone without palpated masses or tenderness.    Assessment/Plan:  73 y.o. [id number] female for breast and pelvic exam  1. Postmenopausal.  No significant menopausal symptoms or any vaginal bleeding. 2. Osteopenia.  DEXA 2016 T score -1.9 FRAX 11% / 1.8%.  Was to schedule DEXA last year but failed to follow-up for this.  I strongly recommended she schedule her DEXA in follow-up for this.  Order placed. 3. Mammography 05/2018.  I reminded patient her mammogram is coming due.  Breast exam normal today. 4. Pap smear 2018.  No Pap smear done today.  No history of abnormal Pap smears previously.  We discussed current screening guidelines and  both agree to stop screening based on age. 5. Colonoscopy 2016.  Repeat at their recommended interval. 6. Anxiety.  Husband recently diagnosed with leukemia.  Granddaughter also recently diagnosed with leukemia.  Son awaiting transplant for end-stage liver disease.  Currently on Paxil 20 mg daily.  Refill x1 year provided.  Uses Xanax 0.5 mg intermittently for sleep.  #30 with 5 refills provided. 7. Health maintenance.  No routine lab work done as patient does this elsewhere.  Follow-up 1 year, sooner as needed.   Anastasio Auerbach MD, 4:13 PM 03/03/2019

## 2019-04-07 ENCOUNTER — Encounter: Payer: Self-pay | Admitting: Gynecology

## 2019-09-07 ENCOUNTER — Telehealth: Payer: Self-pay | Admitting: *Deleted

## 2019-09-07 NOTE — Telephone Encounter (Signed)
Patient called requesting new Rx for Xanax 0.5 mg tablet, reports Rx expired last month. Per note on 03/03/19 "Uses Xanax 0.5 mg intermittently for sleep" patient recently lost her husband to cancer.  Rx will be pending for approval to [facility] drug.

## 2019-09-08 MED ORDER — ALPRAZOLAM 0.5 MG PO TABS: ORAL_TABLET | ORAL | 0 refills | Status: DC

## 2019-09-08 NOTE — Telephone Encounter (Signed)
Okay, I refilled 30 tablets for her, but generally I am not comfortable with refilling benzodiazepines on a long-term basis, if needing more than 30-day supply, I think it would be best for her to see a family or internal medicine provider for those needs.

## 2019-09-09 NOTE — Telephone Encounter (Signed)
Patient informed. 

## 2019-09-20 ENCOUNTER — Encounter: Payer: Self-pay | Admitting: Legal Medicine

## 2019-09-20 ENCOUNTER — Ambulatory Visit (INDEPENDENT_AMBULATORY_CARE_PROVIDER_SITE_OTHER): Payer: Medicare Other | Admitting: Legal Medicine

## 2019-09-20 ENCOUNTER — Other Ambulatory Visit: Payer: Self-pay

## 2019-09-20 VITALS — BP 200/110 | HR 106 | Temp 96.5°F | Resp 17 | Ht 66.14 in | Wt 143.0 lb

## 2019-09-20 DIAGNOSIS — F4321 Adjustment disorder with depressed mood: Secondary | ICD-10-CM

## 2019-09-20 DIAGNOSIS — R300 Dysuria: Secondary | ICD-10-CM | POA: Insufficient documentation

## 2019-09-20 LAB — POCT URINALYSIS DIP (CLINITEK)
Bilirubin, UA: NEGATIVE
Blood, UA: NEGATIVE
Glucose, UA: NEGATIVE mg/dL
Ketones, POC UA: NEGATIVE mg/dL
Leukocytes, UA: NEGATIVE
Nitrite, UA: NEGATIVE
POC PROTEIN,UA: NEGATIVE
Spec Grav, UA: 1.015 (ref 1.010–1.025)
Urobilinogen, UA: 0.2 E.U./dL
pH, UA: 6 (ref 5.0–8.0)

## 2019-09-20 MED ORDER — ALPRAZOLAM 0.5 MG PO TABS: ORAL_TABLET | ORAL | 2 refills | Status: DC

## 2019-09-20 NOTE — Assessment & Plan Note (Signed)
Urinalysis all negative

## 2019-09-20 NOTE — Progress Notes (Signed)
Acute Office Visit  Subjective:    Patient ID: Cheryl Myers, female    DOB: 1945/07/09, 74 y.o.   MRN: [id number]  Chief Complaint  Patient presents with  . Urinary Tract Infection    Since 2 weeks ago, dysuria, urgency  . Anxiety    HPI Patient is in today for depression from loss of husband who died 2 weeks ago for cancer.  She has not discussed with grief counselor. She is trying to adjust to paying bills and caring for house.  Crying. PHQ9 =8.  Using xanax for sleep. She has supportive children.  Past Medical History:  Diagnosis Date  . Anxiety   . Cancer (HCC)    Basal and squamous cell skin cancer  . Hypertension   . Osteopenia 08/2014   T score -1.9 FRAX 11%/1.8%    Past Surgical History:  Procedure Laterality Date  . Basal and squamous cell skin cancers excised     . OVARIAN CYST REMOVAL      Family History  Problem Relation Age of Onset  . Diabetes Mother   . Alzheimer's disease Mother   . Melanoma Brother   . Diabetes Maternal Aunt   . Melanoma Maternal Uncle   . Diabetes Maternal Uncle   . Heart disease Maternal Uncle   . Melanoma Maternal Uncle   . Heart disease Maternal Uncle     Social History   Socioeconomic History  . Marital status: Married    Spouse name: Not on file  . Number of children: Not on file  . Years of education: Not on file  . Highest education level: Not on file  Occupational History  . Not on file  Tobacco Use  . Smoking status: Never Smoker  . Smokeless tobacco: Never Used  Substance and Sexual Activity  . Alcohol use: Yes    Alcohol/week: 1.0 standard drinks    Types: 1 Glasses of wine per week    Comment: socially  . Drug use: No  . Sexual activity: Yes    Birth control/protection: Post-menopausal    Comment: 1st intercourse 70 yo--1 partner  Other Topics Concern  . Not on file  Social History Narrative  . Not on file   Social Determinants of Health   Financial Resource Strain:   . Difficulty of Paying  Living Expenses:   Food Insecurity:   . Worried About Charity fundraiser in the Last Year:   . Arboriculturist in the Last Year:   Transportation Needs:   . Film/video editor (Medical):   Marland Kitchen Lack of Transportation (Non-Medical):   Physical Activity:   . Days of Exercise per Week:   . Minutes of Exercise per Session:   Stress:   . Feeling of Stress :   Social Connections:   . Frequency of Communication with Friends and Family:   . Frequency of Social Gatherings with Friends and Family:   . Attends Religious Services:   . Active Member of Clubs or Organizations:   . Attends Archivist Meetings:   Marland Kitchen Marital Status:   Intimate Partner Violence:   . Fear of Current or Ex-Partner:   . Emotionally Abused:   Marland Kitchen Physically Abused:   . Sexually Abused:     Outpatient Medications Prior to Visit  Medication Sig Dispense Refill  . Cholecalciferol (VITAMIN D PO) Take by mouth.    Marland Kitchen glucosamine-chondroitin 500-400 MG tablet Take by mouth.    Marland Kitchen ibuprofen (ADVIL) 200 MG  tablet Take 400 mg by mouth every 6 (six) hours as needed (pain).     Marland Kitchen PARoxetine (PAXIL) 20 MG tablet Take 1 tablet (20 mg total) by mouth daily. 90 tablet 4  . ALPRAZolam (XANAX) 0.5 MG tablet TAKE 1 TABLET BY MOUTH ONCE DAILY AT BEDTIME AS NEEDED FOR ANXIETY 30 tablet 0   No facility-administered medications prior to visit.    No Known Allergies  Review of Systems  Constitutional: Negative.   HENT: Negative.   Eyes: Negative.   Respiratory: Negative.   Cardiovascular: Negative.   Gastrointestinal: Negative.   Endocrine: Negative.   Musculoskeletal: Negative.   Neurological: Negative.   Psychiatric/Behavioral: Positive for dysphoric mood.       Objective:    Physical Exam Vitals reviewed.  Constitutional:      Appearance: Normal appearance.  HENT:     Head: Normocephalic and atraumatic.     Right Ear: Tympanic membrane normal.     Left Ear: Tympanic membrane normal.  Cardiovascular:      Rate and Rhythm: Normal rate and regular rhythm.     Pulses: Normal pulses.     Heart sounds: Normal heart sounds.  Pulmonary:     Effort: Pulmonary effort is normal.     Breath sounds: Normal breath sounds.  Abdominal:     General: Abdomen is flat.     Palpations: Abdomen is soft.  Musculoskeletal:        General: Normal range of motion.     Cervical back: Normal range of motion and neck supple.  Skin:    General: Skin is warm and dry.     Capillary Refill: Capillary refill takes less than 2 seconds.  Neurological:     General: No focal deficit present.     Mental Status: She is alert and oriented to person, place, and time.     BP (!) 200/110 (BP Location: Right Arm, Patient Position: Sitting)   Pulse (!) 106   Temp (!) 96.5 F (35.8 C) (Temporal)   Resp 17   Ht 5' 6.14" (1.68 m)   Wt 143 lb (64.9 kg)   SpO2 99%   BMI 22.98 kg/m  Wt Readings from Last 3 Encounters:  09/20/19 143 lb (64.9 kg)  03/03/19 171 lb (77.6 kg)  02/25/18 178 lb (80.7 kg)    Health Maintenance Due  Topic Date Due  . Hepatitis C Screening  Never done  . PNA vac Low Risk Adult (2 of 2 - PCV13) 03/04/2018  . INFLUENZA VACCINE  Never done    There are no preventive care reminders to display for this patient.   Lab Results  Component Value Date   TSH 3.18 08/24/2015   Lab Results  Component Value Date   WBC 4.3 01/18/2018   HGB 13.0 01/18/2018   HCT 39.1 01/18/2018   MCV 92.7 01/18/2018   PLT 261 01/18/2018   Lab Results  Component Value Date   NA 140 01/18/2018   K 3.9 01/18/2018   CO2 29 01/18/2018   GLUCOSE 151 (H) 01/18/2018   BUN 8 01/18/2018   CREATININE 0.74 01/18/2018   BILITOT 0.7 10/04/2016   ALKPHOS 66 10/04/2016   AST 12 10/04/2016   ALT 11 10/04/2016   PROT 6.7 10/04/2016   ALBUMIN 4.2 10/04/2016   CALCIUM 9.4 01/18/2018   ANIONGAP 9 01/18/2018   GFR 73.21 10/04/2016   Lab Results  Component Value Date   CHOL 222 (H) 10/04/2016   Lab Results  Component  Value Date   HDL 61.60 10/04/2016   Lab Results  Component Value Date   LDLCALC 144 (H) 10/04/2016   Lab Results  Component Value Date   TRIG 82.0 10/04/2016   Lab Results  Component Value Date   CHOLHDL 4 10/04/2016   No results found for: HGBA1C     Assessment & Plan:   Problem List Items Addressed This Visit      Other   Grief    Patient having grief over death of husband 30 minutes spent discussing.      Relevant Medications   ALPRAZolam (XANAX) 0.5 MG tablet   Dysuria - Primary    Urinalysis all negative      Relevant Orders   POCT URINALYSIS DIP (CLINITEK) (Completed)       Meds ordered this encounter  Medications  . ALPRAZolam (XANAX) 0.5 MG tablet    Sig: TAKE 1 TABLET BY MOUTH ONCE DAILY AT BEDTIME AS NEEDED FOR ANXIETY    Dispense:  60 tablet    Refill:  2     Reinaldo Meeker, MD

## 2019-09-20 NOTE — Patient Instructions (Signed)
Complicated Grief Grief is a normal response to the death of someone close to you. Feelings of fear, anger, and guilt can affect almost everyone who loses a loved one. It is also common to have symptoms of depression while you are grieving. These include problems with sleep, loss of appetite, and lack of energy. They may last for weeks or months after a loss. Complicated grief is different from normal grief or depression. Normal grieving involves sadness and feelings of loss, but those feelings get better and heal over time. Complicated grief is a severe type of grief that lasts for a long time, usually for several months to a year or longer. It interferes with your ability to function normally. Complicated grief may require treatment from a mental health care provider. What are the causes? The cause of this condition is not known. It is not clear why some people continue to struggle with grief and others do not. What increases the risk? You are more likely to develop this condition if:  The death of your loved one was sudden or unexpected.  The death of your loved one was due to a violent event.  Your loved one died from suicide.  Your loved one was a child or a young person.  You were very close to your loved one, or you were dependent on him or her.  You have a history of depression or anxiety. What are the signs or symptoms? Symptoms of this condition include:  Feeling disbelief or having a lack of emotion (numbness).  Being unable to enjoy good memories of your loved one.  Needing to avoid anything or anyone that reminds you of your loved one.  Being unable to stop thinking about the death.  Feeling intense anger or guilt.  Feeling alone and hopeless.  Feeling that your life is meaningless and empty.  Losing the desire to move on with your life. How is this diagnosed? This condition may be diagnosed based on:  Your symptoms. Complicated grief will be diagnosed if you have  ongoing symptoms of grief for 6-12 months or longer.  The effect of symptoms on your life. You may be diagnosed with this condition if your symptoms are interfering with your ability to live your life. Your health care provider may recommend that you see a mental health care provider. Many symptoms of depression are similar to the symptoms of complicated grief. It is important to be evaluated for complicated grief along with other mental health conditions. How is this treated? This condition is most commonly treated with talk therapy. This therapy is offered by a mental health specialist (psychiatrist). During therapy:  You will learn healthy ways to cope with the loss of your loved one.  Your mental health care provider may recommend antidepressant medicines. Follow these instructions at home: Lifestyle   Take care of yourself. ? Eat on a regular basis, and maintain a healthy diet. Eat plenty of fruits, vegetables, lean protein, and whole grains. ? Try to get some exercise each day. Aim for 30 minutes of exercise on most days of the week. ? Keep a consistent sleep schedule. Try to get 8 or more hours of sleep each night. ? Start doing the things that you used to enjoy.  Do not use drugs or alcohol to ease your symptoms.  Spend time with friends and loved ones. General instructions  Take over-the-counter and prescription medicines only as told by your health care provider.  Consider joining a grief (bereavement) support group   to help you deal with your loss.  Keep all follow-up visits as told by your health care provider. This is important. Contact a health care provider if:  Your symptoms prevent you from functioning normally.  Your symptoms do not get better with treatment. Get help right away if:  You have serious thoughts about hurting yourself or someone else.  You have suicidal feelings. If you ever feel like you may hurt yourself or others, or have thoughts about taking  your own life, get help right away. You can go to your nearest emergency department or call:  Your local emergency services (911 in the [place]  A suicide crisis helpline, such as the National Suicide Prevention Lifeline at 1-800-273-8255. This is open 24 hours a day. Summary  Complicated grief is a severe type of grief that lasts for a long time. This grief is not likely to go away on its own. Get the help you need.  Some griefs are more difficult than others and can cause this condition. You may need a certain type of treatment to help you recover if the loss of your loved one was sudden, violent, or due to suicide.  You may feel guilty about moving on with your life. Getting help does not mean that you are forgetting your loved one. It means that you are taking care of yourself.  Complicated grief is best treated with talk therapy. Medicines may also be prescribed.  Seek the help you need, and find support that will help you recover. This information is not intended to replace advice given to you by your health care provider. Make sure you discuss any questions you have with your health care provider. Document Revised: 05/30/2017 Document Reviewed: 04/02/2017 Elsevier Patient Education  2020 [employer]  

## 2019-09-20 NOTE — Assessment & Plan Note (Addendum)
Patient having grief over death of husband 30 minutes spent discussing.

## 2019-10-11 ENCOUNTER — Ambulatory Visit (INDEPENDENT_AMBULATORY_CARE_PROVIDER_SITE_OTHER): Payer: Medicare Other | Admitting: Legal Medicine

## 2019-10-11 ENCOUNTER — Other Ambulatory Visit: Payer: Self-pay

## 2019-10-11 ENCOUNTER — Encounter: Payer: Self-pay | Admitting: Legal Medicine

## 2019-10-11 VITALS — BP 130/90 | HR 88 | Temp 96.4°F | Resp 17 | Ht 66.14 in | Wt 142.4 lb

## 2019-10-11 DIAGNOSIS — F4321 Adjustment disorder with depressed mood: Secondary | ICD-10-CM | POA: Diagnosis not present

## 2019-10-11 NOTE — Assessment & Plan Note (Signed)
Patient is much improved on her grief.  She is not using alprazolam.  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

## 2019-10-11 NOTE — Progress Notes (Signed)
Established Patient Office Visit  Subjective:  Patient ID: Cheryl Myers, female    DOB: October 04, 1945  Age: 74 y.o. MRN: [id number]  CC:  Chief Complaint  Patient presents with  . Anxiety    Follow up of medication alprazolam    HPI Cheryl Myers presents for grief/anxiety. She is having less grief and is getting out and doing decisions.  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  She is thinking of travel.  Past Medical History:  Diagnosis Date  . Anxiety   . Cancer (HCC)    Basal and squamous cell skin cancer  . Hypertension   . Osteopenia 08/2014   T score -1.9 FRAX 11%/1.8%    Past Surgical History:  Procedure Laterality Date  . Basal and squamous cell skin cancers excised     . OVARIAN CYST REMOVAL      Family History  Problem Relation Age of Onset  . Diabetes Mother   . Alzheimer's disease Mother   . Melanoma Brother   . Diabetes Maternal Aunt   . Melanoma Maternal Uncle   . Diabetes Maternal Uncle   . Heart disease Maternal Uncle   . Melanoma Maternal Uncle   . Heart disease Maternal Uncle     Social History   Socioeconomic History  . Marital status: Married    Spouse name: Not on file  . Number of children: Not on file  . Years of education: Not on file  . Highest education level: Not on file  Occupational History  . Not on file  Tobacco Use  . Smoking status: Never Smoker  . Smokeless tobacco: Never Used  Substance and Sexual Activity  . Alcohol use: Yes    Alcohol/week: 1.0 standard drinks    Types: 1 Glasses of wine per week    Comment: socially  . Drug use: No  . Sexual activity: Not Currently    Birth control/protection: Post-menopausal    Comment: 1st intercourse 38 yo--1 partner  Other Topics Concern  . Not on file  Social History Narrative  . Not on file   Social Determinants of Health   Financial Resource Strain:   . Difficulty of Paying Living Expenses:   Food Insecurity:   . Worried About Charity fundraiser in the Last  Year:   . Arboriculturist in the Last Year:   Transportation Needs:   . Film/video editor (Medical):   Marland Kitchen Lack of Transportation (Non-Medical):   Physical Activity:   . Days of Exercise per Week:   . Minutes of Exercise per Session:   Stress:   . Feeling of Stress :   Social Connections:   . Frequency of Communication with Friends and Family:   . Frequency of Social Gatherings with Friends and Family:   . Attends Religious Services:   . Active Member of Clubs or Organizations:   . Attends Archivist Meetings:   Marland Kitchen Marital Status:   Intimate Partner Violence:   . Fear of Current or Ex-Partner:   . Emotionally Abused:   Marland Kitchen Physically Abused:   . Sexually Abused:     Outpatient Medications Prior to Visit  Medication Sig Dispense Refill  . ALPRAZolam (XANAX) 0.5 MG tablet TAKE 1 TABLET BY MOUTH ONCE DAILY AT BEDTIME AS NEEDED FOR ANXIETY 60 tablet 2  . Cholecalciferol (VITAMIN D PO) Take by mouth.    Marland Kitchen glucosamine-chondroitin 500-400 MG tablet Take by mouth.    Marland Kitchen ibuprofen (ADVIL)  200 MG tablet Take 400 mg by mouth every 6 (six) hours as needed (pain).     Marland Kitchen PARoxetine (PAXIL) 20 MG tablet Take 1 tablet (20 mg total) by mouth daily. 90 tablet 4   No facility-administered medications prior to visit.    No Known Allergies  ROS Review of Systems  Constitutional: Negative.   HENT: Negative.   Eyes: Negative.   Respiratory: Negative.   Cardiovascular: Negative.   Gastrointestinal: Negative.   Endocrine: Negative.   Genitourinary: Negative.   Musculoskeletal: Negative.   Skin: Negative.   Neurological: Negative.   Hematological: Negative.   Psychiatric/Behavioral: Positive for dysphoric mood.      Objective:    Physical Exam  Constitutional: She appears well-developed and well-nourished.  Cardiovascular: Normal rate, regular rhythm, normal heart sounds and intact distal pulses.  Pulmonary/Chest: Effort normal and breath sounds normal.  Psychiatric: She  has a normal mood and affect. Her behavior is normal. Judgment and thought content normal.  Vitals reviewed.   BP 130/90 (BP Location: Left Arm, Patient Position: Sitting)   Pulse 88   Temp (!) 96.4 F (35.8 C) (Temporal)   Resp 17   Ht 5' 6.14" (1.68 m)   Wt 142 lb 6.4 oz (64.6 kg)   BMI 22.89 kg/m  Wt Readings from Last 3 Encounters:  10/11/19 142 lb 6.4 oz (64.6 kg)  09/20/19 143 lb (64.9 kg)  03/03/19 171 lb (77.6 kg)     Health Maintenance Due  Topic Date Due  . Hepatitis C Screening  Never done  . PNA vac Low Risk Adult (2 of 2 - PCV13) 03/04/2018    There are no preventive care reminders to display for this patient.  Lab Results  Component Value Date   TSH 3.18 08/24/2015   Lab Results  Component Value Date   WBC 4.3 01/18/2018   HGB 13.0 01/18/2018   HCT 39.1 01/18/2018   MCV 92.7 01/18/2018   PLT 261 01/18/2018   Lab Results  Component Value Date   NA 140 01/18/2018   K 3.9 01/18/2018   CO2 29 01/18/2018   GLUCOSE 151 (H) 01/18/2018   BUN 8 01/18/2018   CREATININE 0.74 01/18/2018   BILITOT 0.7 10/04/2016   ALKPHOS 66 10/04/2016   AST 12 10/04/2016   ALT 11 10/04/2016   PROT 6.7 10/04/2016   ALBUMIN 4.2 10/04/2016   CALCIUM 9.4 01/18/2018   ANIONGAP 9 01/18/2018   GFR 73.21 10/04/2016   Lab Results  Component Value Date   CHOL 222 (H) 10/04/2016   Lab Results  Component Value Date   HDL 61.60 10/04/2016   Lab Results  Component Value Date   LDLCALC 144 (H) 10/04/2016   Lab Results  Component Value Date   TRIG 82.0 10/04/2016   Lab Results  Component Value Date   CHOLHDL 4 10/04/2016   No results found for: HGBA1C    Assessment & Plan:   Problem List Items Addressed This Visit      Other   Grief - Primary    Patient is much improved on her grief.  She is not using alprazolam.  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         No orders of the defined types were placed in this encounter.  Return if  symptoms worsen or fail to improve. Follow-up: Return if symptoms worsen or fail to improve.    Reinaldo Meeker, MD

## 2019-12-01 ENCOUNTER — Other Ambulatory Visit: Payer: Self-pay | Admitting: Legal Medicine

## 2019-12-01 ENCOUNTER — Telehealth: Payer: Self-pay

## 2019-12-01 DIAGNOSIS — F4321 Adjustment disorder with depressed mood: Secondary | ICD-10-CM

## 2019-12-01 DIAGNOSIS — R410 Disorientation, unspecified: Secondary | ICD-10-CM

## 2019-12-01 MED ORDER — PAROXETINE HCL 40 MG PO TABS: 40.0000 mg | ORAL_TABLET | ORAL | 2 refills | Status: DC

## 2019-12-01 NOTE — Telephone Encounter (Signed)
Patient's daughter Abigail Butts asking for referral [facility] because of confusion since the loss of her husband  Daughters number if any questions [phone]

## 2019-12-01 NOTE — Telephone Encounter (Signed)
I called patient , She is being stressed with all the myriad detains of settling her husbands affairs.  She is overwhelmed.  Family is assisting.  She now notes she is crying a lot.  I suggested we increase dose of Paxil to 40 mg.  She will make appointment to see me.  I have arranged a neurology consult per family for possible DAT. lp

## 2019-12-15 ENCOUNTER — Other Ambulatory Visit: Payer: Self-pay

## 2019-12-15 ENCOUNTER — Ambulatory Visit (INDEPENDENT_AMBULATORY_CARE_PROVIDER_SITE_OTHER): Payer: Medicare Other | Admitting: Legal Medicine

## 2019-12-15 ENCOUNTER — Encounter: Payer: Self-pay | Admitting: Legal Medicine

## 2019-12-15 VITALS — BP 138/90 | HR 74 | Temp 97.4°F | Resp 17 | Ht 66.0 in | Wt 141.0 lb

## 2019-12-15 DIAGNOSIS — I10 Essential (primary) hypertension: Secondary | ICD-10-CM | POA: Diagnosis not present

## 2019-12-15 DIAGNOSIS — F419 Anxiety disorder, unspecified: Secondary | ICD-10-CM | POA: Diagnosis not present

## 2019-12-15 DIAGNOSIS — F324 Major depressive disorder, single episode, in partial remission: Secondary | ICD-10-CM | POA: Diagnosis not present

## 2019-12-15 DIAGNOSIS — F4321 Adjustment disorder with depressed mood: Secondary | ICD-10-CM | POA: Diagnosis not present

## 2019-12-15 NOTE — Assessment & Plan Note (Signed)

## 2019-12-15 NOTE — Assessment & Plan Note (Signed)
Patient is doing well on medicines and getting over her husbands death.  She is slowly improving and getting out.

## 2019-12-15 NOTE — Progress Notes (Signed)
Established Patient Office Visit  Subjective:  Patient ID: Cheryl Myers, female    DOB: [birth year]  Age: 74 y.o. MRN: [id number]  CC:  Chief Complaint  Patient presents with  . Depression    HPI [name] presents for follow up depression.  Patient presents for follow up of hypertension.  Patient tolerating diet well with side effects.  Patient was diagnosed with hypertension 2010 so has been treated for hypertension for 10 years.Patient is working on maintaining diet and exercise regimen and follows up as directed. Complication include none.  This patient has major depression for 3 month.  PHQ9 =13.  Patient is having less anhedonia.  The patient has improved future plans and prospects.  The depression is worse with stress.  The patient is not exercising and working on behavior to improve mental health.  Patient is not seeing a therapist or psychiatrist.  na  Patient is on paroxetine and alprazolam.. Past Medical History:  Diagnosis Date  . Anxiety   . Cancer (HCC)    Basal and squamous cell skin cancer  . Hypertension   . Osteopenia 08/2014   T score -1.9 FRAX 11%/1.8%    Past Surgical History:  Procedure Laterality Date  . Basal and squamous cell skin cancers excised     . OVARIAN CYST REMOVAL      Family History  Problem Relation Age of Onset  . Diabetes Mother   . Alzheimer's disease Mother   . Melanoma Brother   . Diabetes Maternal Aunt   . Melanoma Maternal Uncle   . Diabetes Maternal Uncle   . Heart disease Maternal Uncle   . Melanoma Maternal Uncle   . Heart disease Maternal Uncle     Social History   Socioeconomic History  . Marital status: Married    Spouse name: Not on file  . Number of children: Not on file  . Years of education: Not on file  . Highest education level: Not on file  Occupational History  . Not on file  Tobacco Use  . Smoking status: Never Smoker  . Smokeless tobacco: Never Used  Vaping Use  . Vaping Use: Never used    Substance and Sexual Activity  . Alcohol use: Yes    Alcohol/week: 1.0 standard drink    Types: 1 Glasses of wine per week    Comment: socially  . Drug use: No  . Sexual activity: Not Currently    Birth control/protection: Post-menopausal    Comment: 1st intercourse 25 yo--1 partner  Other Topics Concern  . Not on file  Social History Narrative  . Not on file   Social Determinants of Health   Financial Resource Strain:   . Difficulty of Paying Living Expenses:   Food Insecurity:   . Worried About Charity fundraiser in the Last Year:   . Arboriculturist in the Last Year:   Transportation Needs:   . Film/video editor (Medical):   Marland Kitchen Lack of Transportation (Non-Medical):   Physical Activity:   . Days of Exercise per Week:   . Minutes of Exercise per Session:   Stress:   . Feeling of Stress :   Social Connections:   . Frequency of Communication with Friends and Family:   . Frequency of Social Gatherings with Friends and Family:   . Attends Religious Services:   . Active Member of Clubs or Organizations:   . Attends Archivist Meetings:   Marland Kitchen Marital Status:  Intimate Partner Violence:   . Fear of Current or Ex-Partner:   . Emotionally Abused:   Marland Kitchen Physically Abused:   . Sexually Abused:     Outpatient Medications Prior to Visit  Medication Sig Dispense Refill  . ALPRAZolam (XANAX) 0.5 MG tablet TAKE 1 TABLET BY MOUTH ONCE DAILY AT BEDTIME AS NEEDED FOR ANXIETY 60 tablet 2  . Cholecalciferol (VITAMIN D PO) Take by mouth.    Marland Kitchen glucosamine-chondroitin 500-400 MG tablet Take by mouth.    Marland Kitchen ibuprofen (ADVIL) 200 MG tablet Take 400 mg by mouth every 6 (six) hours as needed (pain).     Marland Kitchen PARoxetine (PAXIL) 40 MG tablet Take 1 tablet (40 mg total) by mouth every morning. 90 tablet 2   No facility-administered medications prior to visit.    No Known Allergies  ROS Review of Systems  Constitutional: Negative.   HENT: Negative.   Eyes: Negative.    Respiratory: Negative.   Cardiovascular: Negative.   Gastrointestinal: Negative.   Endocrine: Negative.   Genitourinary: Negative.   Musculoskeletal: Negative.   Skin: Negative.   Neurological: Negative.   Psychiatric/Behavioral: Positive for decreased concentration.      Objective:    Physical Exam Vitals reviewed.  Constitutional:      Appearance: Normal appearance.  HENT:     Head: Normocephalic and atraumatic.  Cardiovascular:     Rate and Rhythm: Normal rate and regular rhythm.     Pulses: Normal pulses.     Heart sounds: Normal heart sounds.  Pulmonary:     Breath sounds: Normal breath sounds.  Neurological:     Mental Status: She is alert.  Psychiatric:        Behavior: Behavior normal.     BP 138/90 (BP Location: Right Arm, Patient Position: Sitting)   Pulse 74   Temp (!) 97.4 F (36.3 C) (Temporal)   Resp 17   Ht 5\' 6"  (1.676 m)   Wt 141 lb (64 kg)   SpO2 97%   BMI 22.76 kg/m  Wt Readings from Last 3 Encounters:  12/15/19 141 lb (64 kg)  10/11/19 142 lb 6.4 oz (64.6 kg)  09/20/19 143 lb (64.9 kg)     Health Maintenance Due  Topic Date Due  . Hepatitis C Screening  Never done  . COVID-19 Vaccine (1) Never done  . PNA vac Low Risk Adult (2 of 2 - PCV13) 03/04/2018    There are no preventive care reminders to display for this patient.  Lab Results  Component Value Date   TSH 3.18 08/24/2015   Lab Results  Component Value Date   WBC 4.3 01/18/2018   HGB 13.0 01/18/2018   HCT 39.1 01/18/2018   MCV 92.7 01/18/2018   PLT 261 01/18/2018   Lab Results  Component Value Date   NA 140 01/18/2018   K 3.9 01/18/2018   CO2 29 01/18/2018   GLUCOSE 151 (H) 01/18/2018   BUN 8 01/18/2018   CREATININE 0.74 01/18/2018   BILITOT 0.7 10/04/2016   ALKPHOS 66 10/04/2016   AST 12 10/04/2016   ALT 11 10/04/2016   PROT 6.7 10/04/2016   ALBUMIN 4.2 10/04/2016   CALCIUM 9.4 01/18/2018   ANIONGAP 9 01/18/2018   GFR 73.21 10/04/2016   Lab Results   Component Value Date   CHOL 222 (H) 10/04/2016   Lab Results  Component Value Date   HDL 61.60 10/04/2016   Lab Results  Component Value Date   LDLCALC 144 (H) 10/04/2016  Lab Results  Component Value Date   TRIG 82.0 10/04/2016   Lab Results  Component Value Date   CHOLHDL 4 10/04/2016   No results found for: HGBA1C    Assessment & Plan:   Problem List Items Addressed This Visit      Cardiovascular and Mediastinum   HTN (hypertension), benign    An individual hypertension care plan was established and reinforced today.  The patient's status was assessed using clinical findings on exam and labs or diagnostic tests. The patient's success at meeting treatment goals on disease specific evidence-based guidelines and found to be well controlled. SELF MANAGEMENT: The patient and I together assessed ways to personally work towards obtaining the recommended goals. RECOMMENDATIONS: avoid decongestants found in common cold remedies, decrease consumption of alcohol, perform routine monitoring of BP with home BP cuff, exercise, reduction of dietary salt, take medicines as prescribed, try not to miss doses and quit smoking.  Regular exercise and maintaining a healthy weight is needed.  Stress reduction may help. A CLINICAL SUMMARY including written plan identify barriers to care unique to individual due to social or financial issues.  We attempt to mutually creat solutions for individual and family understanding.        Other   Anxiety - Primary   Grief   Depression, major, single episode, in partial remission ([place])    Patient is doing well on medicines and getting over her husbands death.  She is slowly improving and getting out.         No orders of the defined types were placed in this encounter.   Follow-up: Return in about 3 months (around 03/16/2020) for depression.    Reinaldo Meeker, MD

## 2019-12-30 ENCOUNTER — Other Ambulatory Visit: Payer: Self-pay | Admitting: Legal Medicine

## 2019-12-30 DIAGNOSIS — F4321 Adjustment disorder with depressed mood: Secondary | ICD-10-CM

## 2020-03-20 ENCOUNTER — Ambulatory Visit (INDEPENDENT_AMBULATORY_CARE_PROVIDER_SITE_OTHER): Payer: Medicare Other | Admitting: Legal Medicine

## 2020-03-20 ENCOUNTER — Encounter: Payer: Self-pay | Admitting: Legal Medicine

## 2020-03-20 ENCOUNTER — Other Ambulatory Visit: Payer: Self-pay

## 2020-03-20 VITALS — BP 110/80 | HR 84 | Temp 97.8°F | Resp 17 | Ht 67.0 in | Wt 137.2 lb

## 2020-03-20 DIAGNOSIS — E782 Mixed hyperlipidemia: Secondary | ICD-10-CM

## 2020-03-20 DIAGNOSIS — I10 Essential (primary) hypertension: Secondary | ICD-10-CM

## 2020-03-20 DIAGNOSIS — F324 Major depressive disorder, single episode, in partial remission: Secondary | ICD-10-CM | POA: Diagnosis not present

## 2020-03-20 DIAGNOSIS — F419 Anxiety disorder, unspecified: Secondary | ICD-10-CM

## 2020-03-20 DIAGNOSIS — F4321 Adjustment disorder with depressed mood: Secondary | ICD-10-CM

## 2020-03-20 NOTE — Progress Notes (Signed)
Subjective:  Patient ID: Cheryl Myers, female    DOB: 04-11-1946  Age: 74 y.o. MRN: [id number]  Chief Complaint  Patient presents with  . Hypertension  . Anxiety    HPI: chronic visit  Patient presents for follow up of hypertension.  Patient tolerating none well with side effects.  Patient was diagnosed with hypertension 2010 so has been treated for hypertension for 10 years.Patient is working on maintaining diet and exercise regimen and follows up as directed. Complication include none.  GAD on xanax. Relative has terminal cancer. 0n paxil also.   Current Outpatient Medications on File Prior to Visit  Medication Sig Dispense Refill  . ALPRAZolam (XANAX) 0.5 MG tablet TAKE 1 TABLET BY MOUTH TWICE DAILY AS NEEDED FOR ANXIETY. 60 tablet 3  . Cholecalciferol (VITAMIN D PO) Take by mouth.    Marland Kitchen glucosamine-chondroitin 500-400 MG tablet Take by mouth.    Marland Kitchen ibuprofen (ADVIL) 200 MG tablet Take 400 mg by mouth every 6 (six) hours as needed (pain).     . imiquimod (ALDARA) 5 % cream Apply topically.    Marland Kitchen PARoxetine (PAXIL) 40 MG tablet Take 1 tablet (40 mg total) by mouth every morning. 90 tablet 2   No current facility-administered medications on file prior to visit.   Past Medical History:  Diagnosis Date  . Anxiety   . Cancer (HCC)    Basal and squamous cell skin cancer  . Osteopenia 08/2014   T score -1.9 FRAX 11%/1.8%   Past Surgical History:  Procedure Laterality Date  . Basal and squamous cell skin cancers excised     . OVARIAN CYST REMOVAL      Family History  Problem Relation Age of Onset  . Diabetes Mother   . Alzheimer's disease Mother   . Melanoma Brother   . Diabetes Maternal Aunt   . Melanoma Maternal Uncle   . Diabetes Maternal Uncle   . Heart disease Maternal Uncle   . Melanoma Maternal Uncle   . Heart disease Maternal Uncle    Social History   Socioeconomic History  . Marital status: Married    Spouse name: Not on file  . Number of children: Not on  file  . Years of education: Not on file  . Highest education level: Not on file  Occupational History  . Not on file  Tobacco Use  . Smoking status: Never Smoker  . Smokeless tobacco: Never Used  Vaping Use  . Vaping Use: Never used  Substance and Sexual Activity  . Alcohol use: Yes    Alcohol/week: 1.0 standard drink    Types: 1 Glasses of wine per week    Comment: socially  . Drug use: No  . Sexual activity: Not Currently    Birth control/protection: Post-menopausal    Comment: 1st intercourse 15 yo--1 partner  Other Topics Concern  . Not on file  Social History Narrative  . Not on file   Social Determinants of Health   Financial Resource Strain:   . Difficulty of Paying Living Expenses: Not on file  Food Insecurity:   . Worried About Charity fundraiser in the Last Year: Not on file  . Ran Out of Food in the Last Year: Not on file  Transportation Needs:   . Lack of Transportation (Medical): Not on file  . Lack of Transportation (Non-Medical): Not on file  Physical Activity:   . Days of Exercise per Week: Not on file  . Minutes of Exercise per Session:  Not on file  Stress:   . Feeling of Stress : Not on file  Social Connections:   . Frequency of Communication with Friends and Family: Not on file  . Frequency of Social Gatherings with Friends and Family: Not on file  . Attends Religious Services: Not on file  . Active Member of Clubs or Organizations: Not on file  . Attends Archivist Meetings: Not on file  . Marital Status: Not on file    Review of Systems  Constitutional: Negative.   HENT: Negative.   Eyes: Negative.   Respiratory: Negative.   Cardiovascular: Negative.   Gastrointestinal: Negative.   Genitourinary: Negative.   Neurological: Negative.   Psychiatric/Behavioral: Negative.      Objective:  BP 110/80   Pulse 84   Temp 97.8 F (36.6 C)   Resp 17   Ht 5\' 7"  (1.702 m)   Wt 137 lb 3.2 oz (62.2 kg)   BMI 21.49 kg/m    BP/Weight 03/20/2020 12/15/2019 9/98/3382  Systolic BP 505 397 673  Diastolic BP 80 90 90  Wt. (Lbs) 137.2 141 142.4  BMI 21.49 22.76 22.89    Physical Exam Vitals reviewed.  Constitutional:      Appearance: Normal appearance.  HENT:     Head: Normocephalic and atraumatic.     Right Ear: Tympanic membrane, ear canal and external ear normal.     Left Ear: Tympanic membrane, ear canal and external ear normal.  Eyes:     Extraocular Movements: Extraocular movements intact.     Conjunctiva/sclera: Conjunctivae normal.     Pupils: Pupils are equal, round, and reactive to light.  Cardiovascular:     Rate and Rhythm: Normal rate and regular rhythm.     Pulses: Normal pulses.     Heart sounds: Normal heart sounds.  Pulmonary:     Effort: Pulmonary effort is normal.     Breath sounds: Normal breath sounds.  Abdominal:     General: Abdomen is flat. Bowel sounds are normal.     Palpations: Abdomen is soft.  Musculoskeletal:        General: Normal range of motion.  Skin:    General: Skin is warm.     Capillary Refill: Capillary refill takes less than 2 seconds.  Neurological:     General: No focal deficit present.     Mental Status: She is alert and oriented to person, place, and time.       Lab Results  Component Value Date   WBC 4.3 01/18/2018   HGB 13.0 01/18/2018   HCT 39.1 01/18/2018   PLT 261 01/18/2018   GLUCOSE 151 (H) 01/18/2018   CHOL 222 (H) 10/04/2016   TRIG 82.0 10/04/2016   HDL 61.60 10/04/2016   LDLCALC 144 (H) 10/04/2016   ALT 11 10/04/2016   AST 12 10/04/2016   NA 140 01/18/2018   K 3.9 01/18/2018   CL 102 01/18/2018   CREATININE 0.74 01/18/2018   BUN 8 01/18/2018   CO2 29 01/18/2018   TSH 3.18 08/24/2015      Assessment & Plan:   1. HTN (hypertension), benign An individual hypertension care plan was established and reinforced today.  The patient's status was assessed using clinical findings on exam and labs or diagnostic tests. The  patient's success at meeting treatment goals on disease specific evidence-based guidelines and found to be well controlled. SELF MANAGEMENT: The patient and I together assessed ways to personally work towards obtaining the recommended goals. RECOMMENDATIONS: avoid  decongestants found in common cold remedies, decrease consumption of alcohol, perform routine monitoring of BP with home BP cuff, exercise, reduction of dietary salt, take medicines as prescribed, try not to miss doses and quit smoking.  Regular exercise and maintaining a healthy weight is needed.  Stress reduction may help. A CLINICAL SUMMARY including written plan identify barriers to care unique to individual due to social or financial issues.  We attempt to mutually creat solutions for individual and family understanding.  2. Depression, major, single episode, in partial remission ([place]) Patient's depression is controlled with paxil.   Anhedonia better.   An individual care plan was established or reinforced today.  The patient's disease status was assessed using clinical findings on exam, labs, and or other diagnostic testing to determine patient's success in meeting treatment goals based on disease specific evidence-based guidelines and found to be improving Recommendations include stay on medicine  3. Anxiety AN INDIVIDUAL CARE PLAN for anxiety was established and reinforced today.  The patient's status was assessed using clinical findings on exam, labs, and other diagnostic testing. Patient's success at meeting treatment goals based on disease specific evidence-bassed guidelines and found to be in good control. RECOMMENDATIONS include maintaining present medicines and treatment.  4. Mixed hyperlipidemia AN INDIVIDUAL CARE PLAN for hyperlipidemia/ cholesterol was established and reinforced today.  The patient's status was assessed using clinical findings on exam, lab and other diagnostic tests. The patient's disease status was assessed  based on evidence-based guidelines and found to be well controlled. MEDICATIONS were reviewed. SELF MANAGEMENT GOALS have been discussed and patient's success at attaining the goal of low cholesterol was assessed. RECOMMENDATION given include regular exercise 3 days a week and low cholesterol/low fat diet. CLINICAL SUMMARY including written plan to identify barriers unique to the patient due to social or economic  reasons was discussed.         Follow-up: Return in about 3 months (around 06/19/2020).  An After Visit Summary was printed and given to the patient.  [place] [phone]

## 2020-03-21 LAB — COMPREHENSIVE METABOLIC PANEL
ALT: 12 IU/L (ref 0–32)
AST: 15 IU/L (ref 0–40)
Albumin/Globulin Ratio: 2.5 — ABNORMAL HIGH (ref 1.2–2.2)
Albumin: 4.7 g/dL (ref 3.7–4.7)
Alkaline Phosphatase: 78 IU/L (ref 44–121)
BUN/Creatinine Ratio: 18 (ref 12–28)
BUN: 15 mg/dL (ref 8–27)
Bilirubin Total: 0.5 mg/dL (ref 0.0–1.2)
CO2: 23 mmol/L (ref 20–29)
Calcium: 9.6 mg/dL (ref 8.7–10.3)
Chloride: 103 mmol/L (ref 96–106)
Creatinine, Ser: 0.82 mg/dL (ref 0.57–1.00)
GFR calc Af Amer: 82 mL/min/{1.73_m2} (ref >59)
GFR calc non Af Amer: 71 mL/min/{1.73_m2} (ref >59)
Globulin, Total: 1.9 g/dL (ref 1.5–4.5)
Glucose: 100 mg/dL — ABNORMAL HIGH (ref 65–99)
Potassium: 5 mmol/L (ref 3.5–5.2)
Sodium: 139 mmol/L (ref 134–144)
Total Protein: 6.6 g/dL (ref 6.0–8.5)

## 2020-03-21 LAB — CBC WITH DIFFERENTIAL/PLATELET
Basophils Absolute: 0 10*3/uL (ref 0.0–0.2)
Basos: 1 %
EOS (ABSOLUTE): 0.1 10*3/uL (ref 0.0–0.4)
Eos: 3 %
Hematocrit: 38.3 % (ref 34.0–46.6)
Hemoglobin: 12.5 g/dL (ref 11.1–15.9)
Immature Grans (Abs): 0 10*3/uL (ref 0.0–0.1)
Immature Granulocytes: 0 %
Lymphocytes Absolute: 1.2 10*3/uL (ref 0.7–3.1)
Lymphs: 28 %
MCH: 30.6 pg (ref 26.6–33.0)
MCHC: 32.6 g/dL (ref 31.5–35.7)
MCV: 94 fL (ref 79–97)
Monocytes Absolute: 0.3 10*3/uL (ref 0.1–0.9)
Monocytes: 8 %
Neutrophils Absolute: 2.6 10*3/uL (ref 1.4–7.0)
Neutrophils: 60 %
Platelets: 273 10*3/uL (ref 150–450)
RBC: 4.09 x10E6/uL (ref 3.77–5.28)
RDW: 12.9 % (ref 11.7–15.4)
WBC: 4.3 10*3/uL (ref 3.4–10.8)

## 2020-03-21 LAB — TSH: TSH: 4.09 u[IU]/mL (ref 0.450–4.500)

## 2020-03-21 NOTE — Progress Notes (Signed)
CBC normal, glucose 100, kidney and liver tests normal, TSH 4.09 normal lp

## 2020-05-31 ENCOUNTER — Other Ambulatory Visit: Payer: Self-pay | Admitting: Legal Medicine

## 2020-05-31 DIAGNOSIS — F4321 Adjustment disorder with depressed mood: Secondary | ICD-10-CM

## 2020-06-20 ENCOUNTER — Ambulatory Visit: Payer: Medicare Other | Admitting: Legal Medicine

## 2020-07-03 ENCOUNTER — Telehealth (INDEPENDENT_AMBULATORY_CARE_PROVIDER_SITE_OTHER): Payer: Medicare Other | Admitting: Legal Medicine

## 2020-07-03 ENCOUNTER — Encounter: Payer: Self-pay | Admitting: Legal Medicine

## 2020-07-03 DIAGNOSIS — J019 Acute sinusitis, unspecified: Secondary | ICD-10-CM | POA: Insufficient documentation

## 2020-07-03 MED ORDER — PREDNISONE 10 MG (21) PO TBPK: ORAL_TABLET | ORAL | 0 refills | Status: DC

## 2020-07-03 MED ORDER — AMOXICILLIN 875 MG PO TABS: 875.0000 mg | ORAL_TABLET | Freq: Two times a day (BID) | ORAL | 0 refills | Status: DC

## 2020-07-03 NOTE — Progress Notes (Signed)
Virtual Visit via Telephone Note   This visit type was conducted due to national recommendations for restrictions regarding the COVID-19 Pandemic (e.g. social distancing) in an effort to limit this patient's exposure and mitigate transmission in our community.  Due to her co-morbid illnesses, this patient is at least at moderate risk for complications without adequate follow up.  This format is felt to be most appropriate for this patient at this time.  The patient did not have access to video technology/had technical difficulties with video requiring transitioning to audio format only (telephone).  All issues noted in this document were discussed and addressed.  No physical exam could be performed with this format.  Patient verbally consented to a telehealth visit.   Date:  07/03/2020   ID:  Christophe Louis, DOB 11/03/1945, MRN [id number]  Patient Location: Home Provider Location: Office/Clinic  PCP:  Abigail Miyamoto, MD   Evaluation Performed:  New Patient Evaluation  Chief Complaint:  congestion  History of Present Illness:    Cheryl Myers is a 75 y.o. female with sinus infection.  No exposure, headache.  Throat sore for 5 days ago. Can smell. She has all covid shots.  The patient does not have symptoms concerning for COVID-19 infection (fever, chills, cough, or new shortness of breath).    Past Medical History:  Diagnosis Date  . Anxiety   . Cancer (HCC)    Basal and squamous cell skin cancer  . Osteopenia 08/2014   T score -1.9 FRAX 11%/1.8%    Past Surgical History:  Procedure Laterality Date  . Basal and squamous cell skin cancers excised     . OVARIAN CYST REMOVAL      Family History  Problem Relation Age of Onset  . Diabetes Mother   . Alzheimer's disease Mother   . Melanoma Brother   . Diabetes Maternal Aunt   . Melanoma Maternal Uncle   . Diabetes Maternal Uncle   . Heart disease Maternal Uncle   . Melanoma Maternal Uncle   . Heart disease Maternal  Uncle     Social History   Socioeconomic History  . Marital status: Married    Spouse name: Not on file  . Number of children: Not on file  . Years of education: Not on file  . Highest education level: Not on file  Occupational History  . Not on file  Tobacco Use  . Smoking status: Never Smoker  . Smokeless tobacco: Never Used  Vaping Use  . Vaping Use: Never used  Substance and Sexual Activity  . Alcohol use: Yes    Alcohol/week: 1.0 standard drink    Types: 1 Glasses of wine per week    Comment: socially  . Drug use: No  . Sexual activity: Not Currently    Birth control/protection: Post-menopausal    Comment: 1st intercourse 43 yo--1 partner  Other Topics Concern  . Not on file  Social History Narrative  . Not on file   Social Determinants of Health   Financial Resource Strain: Not on file  Food Insecurity: Not on file  Transportation Needs: Not on file  Physical Activity: Not on file  Stress: Not on file  Social Connections: Not on file  Intimate Partner Violence: Not on file    Outpatient Medications Prior to Visit  Medication Sig Dispense Refill  . ALPRAZolam (XANAX) 0.5 MG tablet TAKE 1 TABLET BY MOUTH TWICE DAILY AS NEEDED FOR ANXIETY. 60 tablet 3  . Cholecalciferol (VITAMIN D PO)  Take by mouth.    Marland Kitchen glucosamine-chondroitin 500-400 MG tablet Take by mouth.    Marland Kitchen ibuprofen (ADVIL) 200 MG tablet Take 400 mg by mouth every 6 (six) hours as needed (pain).     . imiquimod (ALDARA) 5 % cream Apply topically.    Marland Kitchen PARoxetine (PAXIL) 40 MG tablet Take 1 tablet (40 mg total) by mouth every morning. 90 tablet 2   No facility-administered medications prior to visit.    Allergies:   Patient has no known allergies.   Social History   Tobacco Use  . Smoking status: Never Smoker  . Smokeless tobacco: Never Used  Vaping Use  . Vaping Use: Never used  Substance Use Topics  . Alcohol use: Yes    Alcohol/week: 1.0 standard drink    Types: 1 Glasses of wine per  week    Comment: socially  . Drug use: No     ROS   Labs/Other Tests and Data Reviewed:    Recent Labs: 03/20/2020: ALT 12; BUN 15; Creatinine, Ser 0.82; Hemoglobin 12.5; Platelets 273; Potassium 5.0; Sodium 139; TSH 4.090   Recent Lipid Panel Lab Results  Component Value Date/Time   CHOL 222 (H) 10/04/2016 08:59 AM   TRIG 82.0 10/04/2016 08:59 AM   HDL 61.60 10/04/2016 08:59 AM   CHOLHDL 4 10/04/2016 08:59 AM   LDLCALC 144 (H) 10/04/2016 08:59 AM    Wt Readings from Last 3 Encounters:  07/03/20 137 lb (62.1 kg)  03/20/20 137 lb 3.2 oz (62.2 kg)  12/15/19 141 lb (64 kg)     Objective:    Vital Signs:  BP (!) 149/80   Ht 5\' 7"  (1.702 m)   Wt 137 lb (62.1 kg)   BMI 21.46 kg/m    Physical Exam vs reviewed  ASSESSMENT & PLAN:   Diagnoses and all orders for this visit: Acute non-recurrent sinusitis, unspecified location -     predniSONE (STERAPRED UNI-PAK 21 TAB) 10 MG (21) TBPK tablet; Take 6ills first day , then 5 pills day 2 and then cut down one pill day until gone -     amoxicillin (AMOXIL) 875 MG tablet; Take 1 tablet (875 mg total) by mouth 2 (two) times daily. Treat sinusitis, we discussed her depression       COVID-19 Education: The signs and symptoms of COVID-19 were discussed with the patient and how to seek care for testing (follow up with PCP or arrange E-visit). The importance of social distancing was discussed today.   I spent 15 minutes dedicated to the care of this patient on the date of this encounter to include face-to-face time with the patient, as well as:   Follow Up:  In Person prn  Signed,  Reinaldo Meeker, MD  07/03/2020 10:33 AM    [place]

## 2020-09-04 ENCOUNTER — Other Ambulatory Visit: Payer: Self-pay | Admitting: Legal Medicine

## 2020-09-04 DIAGNOSIS — F4321 Adjustment disorder with depressed mood: Secondary | ICD-10-CM

## 2020-11-20 ENCOUNTER — Other Ambulatory Visit: Payer: Self-pay | Admitting: Legal Medicine

## 2020-11-20 DIAGNOSIS — F4321 Adjustment disorder with depressed mood: Secondary | ICD-10-CM

## 2020-11-21 ENCOUNTER — Encounter: Payer: Self-pay | Admitting: Legal Medicine

## 2020-11-21 ENCOUNTER — Ambulatory Visit (INDEPENDENT_AMBULATORY_CARE_PROVIDER_SITE_OTHER): Payer: Medicare Other | Admitting: Legal Medicine

## 2020-11-21 VITALS — BP 122/80 | HR 73 | Temp 97.5°F | Resp 16

## 2020-11-21 DIAGNOSIS — J029 Acute pharyngitis, unspecified: Secondary | ICD-10-CM

## 2020-11-21 DIAGNOSIS — R059 Cough, unspecified: Secondary | ICD-10-CM

## 2020-11-21 DIAGNOSIS — U071 COVID-19: Secondary | ICD-10-CM | POA: Diagnosis not present

## 2020-11-21 LAB — POCT RAPID STREP A (OFFICE): Rapid Strep A Screen: NEGATIVE

## 2020-11-21 LAB — POC COVID19 BINAXNOW: SARS Coronavirus 2 Ag: POSITIVE — AB

## 2020-11-21 MED ORDER — MOLNUPIRAVIR EUA 200MG CAPSULE: 4.0000 | ORAL_CAPSULE | Freq: Two times a day (BID) | ORAL | 0 refills | Status: DC

## 2020-11-21 MED ORDER — MOLNUPIRAVIR EUA 200MG CAPSULE: 4.0000 | ORAL_CAPSULE | Freq: Two times a day (BID) | ORAL | 0 refills | Status: AC

## 2020-11-21 NOTE — Progress Notes (Signed)
Subjective:  Patient ID: Cheryl Myers, female    DOB: 1946/03/05  Age: 75 y.o. MRN: [id number]  Chief Complaint  Patient presents with  . Covid Positive    HPI: patient has been having a headache, congested and cough for 5 days.  No fever or chills.  She does not know who she got it from.  She has had immunizations   Current Outpatient Medications on File Prior to Visit  Medication Sig Dispense Refill  . ALPRAZolam (XANAX) 0.5 MG tablet TAKE 1 TABLET BY MOUTH TWICE DAILY AS NEEDED FOR ANXIETY. 60 tablet 3  . amoxicillin (AMOXIL) 875 MG tablet Take 1 tablet (875 mg total) by mouth 2 (two) times daily. 20 tablet 0  . Cholecalciferol (VITAMIN D PO) Take by mouth.    Marland Kitchen glucosamine-chondroitin 500-400 MG tablet Take by mouth.    Marland Kitchen ibuprofen (ADVIL) 200 MG tablet Take 400 mg by mouth every 6 (six) hours as needed (pain).     . imiquimod (ALDARA) 5 % cream Apply topically.    Marland Kitchen PARoxetine (PAXIL) 40 MG tablet TAKE 1 TABLET BY MOUTH EVERY MORNING 90 tablet 2  . predniSONE (STERAPRED UNI-PAK 21 TAB) 10 MG (21) TBPK tablet Take 6ills first day , then 5 pills day 2 and then cut down one pill day until gone 21 tablet 0   No current facility-administered medications on file prior to visit.   Past Medical History:  Diagnosis Date  . Anxiety   . Cancer (HCC)    Basal and squamous cell skin cancer  . Osteopenia 08/2014   T score -1.9 FRAX 11%/1.8%   Past Surgical History:  Procedure Laterality Date  . Basal and squamous cell skin cancers excised     . OVARIAN CYST REMOVAL      Family History  Problem Relation Age of Onset  . Diabetes Mother   . Alzheimer's disease Mother   . Melanoma Brother   . Diabetes Maternal Aunt   . Melanoma Maternal Uncle   . Diabetes Maternal Uncle   . Heart disease Maternal Uncle   . Melanoma Maternal Uncle   . Heart disease Maternal Uncle    Social History   Socioeconomic History  . Marital status: Married    Spouse name: Not on file  . Number of  children: Not on file  . Years of education: Not on file  . Highest education level: Not on file  Occupational History  . Not on file  Tobacco Use  . Smoking status: Never Smoker  . Smokeless tobacco: Never Used  Vaping Use  . Vaping Use: Never used  Substance and Sexual Activity  . Alcohol use: Yes    Alcohol/week: 1.0 standard drink    Types: 1 Glasses of wine per week    Comment: socially  . Drug use: No  . Sexual activity: Not Currently    Birth control/protection: Post-menopausal    Comment: 1st intercourse 74 yo--1 partner  Other Topics Concern  . Not on file  Social History Narrative  . Not on file   Social Determinants of Health   Financial Resource Strain: Not on file  Food Insecurity: Not on file  Transportation Needs: Not on file  Physical Activity: Not on file  Stress: Not on file  Social Connections: Not on file    Review of Systems  Constitutional: Negative for activity change and appetite change.  HENT: Positive for congestion and sinus pain.   Eyes: Negative for visual disturbance.  Respiratory:  Positive for cough. Negative for chest tightness and shortness of breath.   Cardiovascular: Negative for chest pain, palpitations and leg swelling.  Genitourinary: Negative for difficulty urinating and dysuria.  Neurological: Positive for headaches.     Objective:  BP 122/80 (BP Location: Left Arm, Patient Position: Sitting, Cuff Size: Large)   Pulse 73   Temp (!) 97.5 F (36.4 C) (Temporal)   Resp 16   SpO2 95%   BP/Weight 11/21/2020 07/03/2020 07/27/5168  Systolic BP 017 494 496  Diastolic BP 80 80 80  Wt. (Lbs) - 137 137.2  BMI - 21.46 21.49   Patient was seen  Using universal precautions Physical Exam Vitals reviewed.  Constitutional:      General: She is in acute distress.     Appearance: Normal appearance. She is normal weight.  HENT:     Right Ear: Tympanic membrane, ear canal and external ear normal.     Left Ear: Tympanic membrane, ear  canal and external ear normal.     Nose: Congestion present.     Mouth/Throat:     Mouth: Mucous membranes are moist.     Pharynx: Oropharynx is clear.  Eyes:     Extraocular Movements: Extraocular movements intact.     Conjunctiva/sclera: Conjunctivae normal.     Pupils: Pupils are equal, round, and reactive to light.  Cardiovascular:     Rate and Rhythm: Normal rate and regular rhythm.     Pulses: Normal pulses.     Heart sounds: Normal heart sounds. No murmur heard. No gallop.   Pulmonary:     Effort: Pulmonary effort is normal. No respiratory distress.     Breath sounds: Normal breath sounds. No rales.  Abdominal:     General: Abdomen is flat. Bowel sounds are normal. There is no distension.     Palpations: Abdomen is soft.     Tenderness: There is no abdominal tenderness.  Skin:    Capillary Refill: Capillary refill takes less than 2 seconds.  Neurological:     General: No focal deficit present.     Mental Status: She is alert and oriented to person, place, and time.       Lab Results  Component Value Date   WBC 4.3 03/20/2020   HGB 12.5 03/20/2020   HCT 38.3 03/20/2020   PLT 273 03/20/2020   GLUCOSE 100 (H) 03/20/2020   CHOL 222 (H) 10/04/2016   TRIG 82.0 10/04/2016   HDL 61.60 10/04/2016   LDLCALC 144 (H) 10/04/2016   ALT 12 03/20/2020   AST 15 03/20/2020   NA 139 03/20/2020   K 5.0 03/20/2020   CL 103 03/20/2020   CREATININE 0.82 03/20/2020   BUN 15 03/20/2020   CO2 23 03/20/2020   TSH 4.090 03/20/2020      Assessment & Plan:   1. Cough - POC COVID-19 BinaxNow Patient has cough with positive Covid  2. Sore throat - POCT rapid strep A Strep tests are negative  3. COVID - molnupiravir EUA 200 mg CAPS; Take 4 capsules (800 mg total) by mouth 2 (two) times daily for 5 days.  Dispense: 40 capsule; Refill: 0 Patient has active Covid and will quarantine for 5 days , then wear mask.    Meds ordered this encounter  Medications  . DISCONTD:  molnupiravir EUA 200 mg CAPS    Sig: Take 4 capsules (800 mg total) by mouth 2 (two) times daily for 5 days.    Dispense:  40 capsule  Refill:  0  . molnupiravir EUA 200 mg CAPS    Sig: Take 4 capsules (800 mg total) by mouth 2 (two) times daily for 5 days.    Dispense:  40 capsule    Refill:  0    Orders Placed This Encounter  Procedures  . POC COVID-19 BinaxNow  . POCT rapid strep A     Follow-up: No follow-ups on file.  An After Visit Summary was printed and given to the patient.  Reinaldo Meeker, MD [facility] [phone]

## 2021-03-21 ENCOUNTER — Ambulatory Visit (INDEPENDENT_AMBULATORY_CARE_PROVIDER_SITE_OTHER): Payer: Medicare Other | Admitting: Legal Medicine

## 2021-03-21 ENCOUNTER — Encounter: Payer: Self-pay | Admitting: Legal Medicine

## 2021-03-21 ENCOUNTER — Other Ambulatory Visit: Payer: Self-pay

## 2021-03-21 VITALS — BP 150/80 | HR 85 | Temp 97.6°F | Resp 16 | Ht 67.0 in | Wt 140.0 lb

## 2021-03-21 DIAGNOSIS — F324 Major depressive disorder, single episode, in partial remission: Secondary | ICD-10-CM | POA: Diagnosis not present

## 2021-03-21 DIAGNOSIS — A09 Infectious gastroenteritis and colitis, unspecified: Secondary | ICD-10-CM | POA: Insufficient documentation

## 2021-03-21 DIAGNOSIS — R5383 Other fatigue: Secondary | ICD-10-CM | POA: Diagnosis not present

## 2021-03-21 MED ORDER — ARIPIPRAZOLE 2 MG PO TABS: 2.0000 mg | ORAL_TABLET | Freq: Every evening | ORAL | 3 refills | Status: DC | PRN

## 2021-03-21 NOTE — Progress Notes (Signed)
Established Patient Office Visit  Subjective:  Patient ID: Cheryl Myers, female    DOB: 07/13/1945  Age: 75 y.o. MRN: [id number]  CC:  Chief Complaint  Patient presents with   Diarrhea    Since 5 weeks ago, patient has watery diarrhea comes and goes after she eats. She noticed after she ate a hamburguer.    HPI DHARA SCHEPP presents for chronic diarrhea. For 5 weeks.  She had eaten out one burger. The diarrhea comes and goes. She is  using immodium.  3 or 4 times.  She is feeling depressed lately. She is crying  a lot.  PHQ 11. She is feeling bad.  She was improved, on paxil and xanax. We discussed her anniversary death of her husband.  I counseled 20 minutes Depression screen [facility] 2/9 03/21/2021 03/20/2020 12/15/2019 09/20/2019 09/26/2016  Decreased Interest 2 2 1 3 1   Down, Depressed, Hopeless 3 2 3 3 1   PHQ - 2 Score 5 4 4 6 2   Altered sleeping 3 1 3 3 3   Tired, decreased energy 0 0 0 2 3  Change in appetite 1 1 0 2 0  Feeling bad or failure about yourself  2 0 2 1 0  Trouble concentrating 0 0 0 0 0  Moving slowly or fidgety/restless 0 0 3 0 0  Suicidal thoughts 0 0 0 0 0  PHQ-9 Score 11 6 12 14 8   Difficult doing work/chores Very difficult Very difficult Somewhat difficult Very difficult -     Past Medical History:  Diagnosis Date   Anxiety    Cancer ([place])    Basal and squamous cell skin cancer   Osteopenia 08/2014   T score -1.9 FRAX 11%/1.8%    Past Surgical History:  Procedure Laterality Date   Basal and squamous cell skin cancers excised      OVARIAN CYST REMOVAL      Family History  Problem Relation Age of Onset   Diabetes Mother    Alzheimer's disease Mother    Melanoma Brother    Diabetes Maternal Aunt    Melanoma Maternal Uncle    Diabetes Maternal Uncle    Heart disease Maternal Uncle    Melanoma Maternal Uncle    Heart disease Maternal Uncle     Social History   Socioeconomic History   Marital status: Married    Spouse name: Not on file    Number of children: Not on file   Years of education: Not on file   Highest education level: Not on file  Occupational History   Not on file  Tobacco Use   Smoking status: Never   Smokeless tobacco: Never  Vaping Use   Vaping Use: Never used  Substance and Sexual Activity   Alcohol use: Yes    Alcohol/week: 1.0 standard drink    Types: 1 Glasses of wine per week    Comment: socially   Drug use: No   Sexual activity: Not Currently    Birth control/protection: Post-menopausal    Comment: 1st intercourse 64 yo--1 partner  Other Topics Concern   Not on file  Social History Narrative   Not on file   Social Determinants of Health   Financial Resource Strain: Not on file  Food Insecurity: Not on file  Transportation Needs: Not on file  Physical Activity: Not on file  Stress: Not on file  Social Connections: Not on file  Intimate Partner Violence: Not on file    Outpatient Medications Prior  to Visit  Medication Sig Dispense Refill   ALPRAZolam (XANAX) 0.5 MG tablet TAKE 1 TABLET BY MOUTH TWICE DAILY AS NEEDED FOR ANXIETY. 60 tablet 3   ibuprofen (ADVIL) 200 MG tablet Take 400 mg by mouth every 6 (six) hours as needed (pain).      imiquimod (ALDARA) 5 % cream Apply topically.     PARoxetine (PAXIL) 40 MG tablet TAKE 1 TABLET BY MOUTH EVERY MORNING 90 tablet 2   amoxicillin (AMOXIL) 875 MG tablet Take 1 tablet (875 mg total) by mouth 2 (two) times daily. 20 tablet 0   Cholecalciferol (VITAMIN D PO) Take by mouth.     glucosamine-chondroitin 500-400 MG tablet Take by mouth.     predniSONE (STERAPRED UNI-PAK 21 TAB) 10 MG (21) TBPK tablet Take 6ills first day , then 5 pills day 2 and then cut down one pill day until gone 21 tablet 0   No facility-administered medications prior to visit.    No Known Allergies  ROS Review of Systems  Constitutional:  Negative for activity change and appetite change.  HENT:  Negative for congestion.   Eyes:  Negative for visual disturbance.   Respiratory:  Negative for chest tightness and shortness of breath.   Cardiovascular:  Negative for chest pain and palpitations.  Gastrointestinal:  Negative for abdominal distention and abdominal pain.  Endocrine: Negative for polyuria.  Genitourinary:  Negative for difficulty urinating and dysuria.  Musculoskeletal:  Negative for arthralgias and back pain.  Neurological: Negative.   Psychiatric/Behavioral:  Positive for dysphoric mood.      Objective:    Physical Exam Vitals reviewed.  Constitutional:      General: She is not in acute distress.    Appearance: Normal appearance.  HENT:     Right Ear: Tympanic membrane, ear canal and external ear normal.     Left Ear: Tympanic membrane, ear canal and external ear normal.     Nose: Nose normal.     Mouth/Throat:     Mouth: Mucous membranes are moist.  Eyes:     Extraocular Movements: Extraocular movements intact.     Conjunctiva/sclera: Conjunctivae normal.     Pupils: Pupils are equal, round, and reactive to light.  Cardiovascular:     Rate and Rhythm: Normal rate and regular rhythm.     Pulses: Normal pulses.     Heart sounds: Normal heart sounds. No murmur heard.   No gallop.  Pulmonary:     Effort: Pulmonary effort is normal. No respiratory distress.     Breath sounds: Normal breath sounds. No wheezing.  Abdominal:     General: Abdomen is flat. Bowel sounds are normal. There is no distension.     Palpations: Abdomen is soft.     Tenderness: There is no abdominal tenderness.  Musculoskeletal:        General: Normal range of motion.     Cervical back: Normal range of motion and neck supple.  Skin:    General: Skin is warm.     Capillary Refill: Capillary refill takes less than 2 seconds.  Neurological:     General: No focal deficit present.     Mental Status: She is alert and oriented to person, place, and time.     Gait: Gait normal.     Deep Tendon Reflexes: Reflexes normal.  Psychiatric:        Mood and  Affect: Mood normal.        Thought Content: Thought content normal.  BP (!) 150/80   Pulse 85   Temp 97.6 F (36.4 C)   Resp 16   Ht 5\' 7"  (1.702 m)   Wt 140 lb (63.5 kg)   SpO2 95%   BMI 21.93 kg/m  Wt Readings from Last 3 Encounters:  03/21/21 140 lb (63.5 kg)  07/03/20 137 lb (62.1 kg)  03/20/20 137 lb 3.2 oz (62.2 kg)     Health Maintenance Due  Topic Date Due   COVID-19 Vaccine (1) Never done   Hepatitis C Screening  Never done   Zoster Vaccines- Shingrix (1 of 2) Never done   MAMMOGRAM  05/07/2020   INFLUENZA VACCINE  Never done    There are no preventive care reminders to display for this patient.  Lab Results  Component Value Date   TSH 4.090 03/20/2020   Lab Results  Component Value Date   WBC 4.3 03/20/2020   HGB 12.5 03/20/2020   HCT 38.3 03/20/2020   MCV 94 03/20/2020   PLT 273 03/20/2020   Lab Results  Component Value Date   NA 139 03/20/2020   K 5.0 03/20/2020   CO2 23 03/20/2020   GLUCOSE 100 (H) 03/20/2020   BUN 15 03/20/2020   CREATININE 0.82 03/20/2020   BILITOT 0.5 03/20/2020   ALKPHOS 78 03/20/2020   AST 15 03/20/2020   ALT 12 03/20/2020   PROT 6.6 03/20/2020   ALBUMIN 4.7 03/20/2020   CALCIUM 9.6 03/20/2020   ANIONGAP 9 01/18/2018   GFR 73.21 10/04/2016   Lab Results  Component Value Date   CHOL 222 (H) 10/04/2016   Lab Results  Component Value Date   HDL 61.60 10/04/2016   Lab Results  Component Value Date   LDLCALC 144 (H) 10/04/2016   Lab Results  Component Value Date   TRIG 82.0 10/04/2016   Lab Results  Component Value Date   CHOLHDL 4 10/04/2016   No results found for: HGBA1C    Assessment & Plan:   Diagnoses and all orders for this visit: Depression, major, single episode, in partial remission (HCC) -     ARIPiprazole (ABILIFY) 2 MG tablet; Take 1 tablet (2 mg total) by mouth at bedtime as needed. -     AMB Referral to [place] Patient is having increased depression, add  aripiprazole at night.  Continue counseling. She is not suicidal.  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    Follow-up: Return in about 2 years (around 03/22/2023) for diarrhea.    Reinaldo Meeker, MD

## 2021-03-22 ENCOUNTER — Telehealth: Payer: Self-pay | Admitting: Legal Medicine

## 2021-03-22 LAB — CBC WITH DIFFERENTIAL/PLATELET
Basophils Absolute: 0 x10E3/uL (ref 0.0–0.2)
Basos: 1 %
EOS (ABSOLUTE): 0.1 x10E3/uL (ref 0.0–0.4)
Eos: 2 %
Hematocrit: 35.4 % (ref 34.0–46.6)
Hemoglobin: 11.6 g/dL (ref 11.1–15.9)
Immature Grans (Abs): 0 x10E3/uL (ref 0.0–0.1)
Immature Granulocytes: 0 %
Lymphocytes Absolute: 1.5 x10E3/uL (ref 0.7–3.1)
Lymphs: 39 %
MCH: 29.7 pg (ref 26.6–33.0)
MCHC: 32.8 g/dL (ref 31.5–35.7)
MCV: 91 fL (ref 79–97)
Monocytes Absolute: 0.3 x10E3/uL (ref 0.1–0.9)
Monocytes: 8 %
Neutrophils Absolute: 1.8 x10E3/uL (ref 1.4–7.0)
Neutrophils: 50 %
Platelets: 253 x10E3/uL (ref 150–450)
RBC: 3.9 x10E6/uL (ref 3.77–5.28)
RDW: 12 % (ref 11.7–15.4)
WBC: 3.7 x10E3/uL (ref 3.4–10.8)

## 2021-03-22 LAB — COMPREHENSIVE METABOLIC PANEL
ALT: 13 IU/L (ref 0–32)
AST: 16 IU/L (ref 0–40)
Albumin/Globulin Ratio: 2.1 (ref 1.2–2.2)
Albumin: 4.4 g/dL (ref 3.7–4.7)
Alkaline Phosphatase: 71 IU/L (ref 44–121)
BUN/Creatinine Ratio: 13 (ref 12–28)
BUN: 11 mg/dL (ref 8–27)
Bilirubin Total: 0.5 mg/dL (ref 0.0–1.2)
CO2: 24 mmol/L (ref 20–29)
Calcium: 9.7 mg/dL (ref 8.7–10.3)
Chloride: 103 mmol/L (ref 96–106)
Creatinine, Ser: 0.84 mg/dL (ref 0.57–1.00)
Globulin, Total: 2.1 g/dL (ref 1.5–4.5)
Glucose: 79 mg/dL (ref 65–99)
Potassium: 4.3 mmol/L (ref 3.5–5.2)
Sodium: 141 mmol/L (ref 134–144)
Total Protein: 6.5 g/dL (ref 6.0–8.5)
eGFR: 73 mL/min/{1.73_m2} (ref >59)

## 2021-03-22 LAB — TSH: TSH: 3.66 u[IU]/mL (ref 0.450–4.500)

## 2021-03-22 NOTE — Progress Notes (Signed)
Cbc normal, kidney and liver tests normal, TSH 3.66 normal,  lp

## 2021-03-22 NOTE — Progress Notes (Signed)
  Chronic Care Management   Note  03/22/2021 Name: Cheryl Myers MRN: [id number] DOB: [birth year]  Cheryl Myers is a 75 y.o. year old female who is a primary care patient of Lillard Anes, MD. I reached out to Precious Bard by phone today in response to a referral sent by [name]'s PCP, Lillard Anes, MD.   Ms. Nipper was given information about Chronic Care Management services today including:  CCM service includes personalized support from designated clinical staff supervised by her physician, including individualized plan of care and coordination with other care providers 24/7 contact phone numbers for assistance for urgent and routine care needs. Service will only be billed when office clinical staff spend 20 minutes or more in a month to coordinate care. Only one practitioner may furnish and bill the service in a calendar month. The patient may stop CCM services at any time (effective at the end of the month) by phone call to the office staff.   Patient agreed to services and verbal consent obtained.   Follow up plan:   Tatjana Secretary/administrator

## 2021-03-23 DIAGNOSIS — A09 Infectious gastroenteritis and colitis, unspecified: Secondary | ICD-10-CM | POA: Diagnosis not present

## 2021-03-29 ENCOUNTER — Telehealth: Payer: Self-pay

## 2021-03-29 NOTE — Telephone Encounter (Signed)
cannot leave messge due to voicemail not set up to r/s appointment due to provider not in office.

## 2021-04-02 LAB — CDIFF NAA+O+P+STOOL CULTURE
E coli, Shiga toxin Assay: NEGATIVE
Toxigenic C. Difficile by PCR: NEGATIVE

## 2021-04-04 ENCOUNTER — Ambulatory Visit: Payer: Managed Care, Other (non HMO) | Admitting: Legal Medicine

## 2021-04-06 ENCOUNTER — Telehealth: Payer: Self-pay

## 2021-04-06 NOTE — Chronic Care Management (AMB) (Signed)
    Chronic Care Management Pharmacy Assistant   Name: JANET HUMPHREYS  MRN: [id number] DOB: 01-03-1946   Reason for Encounter: Chart review for CPP visit on 04-13-2021   Conditions to be addressed/monitored: HTN, HLD, and Depression   Recent office visits:  03-21-2021 Lillard Anes, MD. START abilify 2 mg by mouth at bedtime as needed. Completed course of amoxicillin, glucosamine-chondroitin, prednisone and vitamin D.  11-21-2020 Lillard Anes, MD. Positive covid test. START molnupiravir 200 mg take 800 mg twice daily for 5 days.  Recent consult visits:  None  Hospital visits:  None in previous 6 months  Medications: Outpatient Encounter Medications as of 04/06/2021  Medication Sig   ALPRAZolam (XANAX) 0.5 MG tablet TAKE 1 TABLET BY MOUTH TWICE DAILY AS NEEDED FOR ANXIETY.   ARIPiprazole (ABILIFY) 2 MG tablet Take 1 tablet (2 mg total) by mouth at bedtime as needed.   ibuprofen (ADVIL) 200 MG tablet Take 400 mg by mouth every 6 (six) hours as needed (pain).    imiquimod (ALDARA) 5 % cream Apply topically.   PARoxetine (PAXIL) 40 MG tablet TAKE 1 TABLET BY MOUTH EVERY MORNING   No facility-administered encounter medications on file as of 04/06/2021.    No results found for: HGBA1C, MICROALBUR   BP Readings from Last 3 Encounters:  03/21/21 (!) 150/80  11/21/20 122/80  07/03/20 (!) 149/80      Have you seen any other providers since your last visit with PCP? Patient stated no  Any changes in your medications or health? Patient stated no  Any side effects from any medications? Patient stated no  Do you have an symptoms or problems not managed by your medications? Patient stated no   Any concerns about your health right now? Patient stated no  Has your provider asked that you check blood pressure, blood sugar, or follow special diet at home? Patient states she checks blood pressure once weekly.  Do you get any type of exercise on a regular basis?  Patient states she cleans a lot in her house and is active daily.  Can you think of a goal you would like to reach for your health? Patient states she will like to stay healthy.  Do you have any problems getting your medications? Patient stated no  Is there anything that you would like to discuss during the appointment? Patient states nothing at the moment.   STAPHANIE HARBISON was reminded to have all medications, supplements and any blood glucose and blood pressure readings available for review with Clarise Cruz B. Joline Salt. D, at her telephone visit on 04-13-2021 at 2:30.   NOTES: Patient stated she is overall doing well and is pleased with her health. She is now in the process of renovating her house since her husband passed away a few years ago. Patient spoke very highly of her husband and misses him so much. Patient tries to stay busy daily to keep her mind off of things but feels grateful for her health.   Star Rating Drugs:  None  Care Gaps: Last annual wellness visit? None Last eye exam / retinopathy screening? Patient states her last exam was about 6 years ago but she is working on getting an appointment Last diabetic foot exam? None  Jeannette How CMA Catering manager [phone]

## 2021-04-13 ENCOUNTER — Ambulatory Visit (INDEPENDENT_AMBULATORY_CARE_PROVIDER_SITE_OTHER): Payer: Medicare Other

## 2021-04-13 ENCOUNTER — Other Ambulatory Visit: Payer: Self-pay

## 2021-04-13 DIAGNOSIS — E782 Mixed hyperlipidemia: Secondary | ICD-10-CM

## 2021-04-13 DIAGNOSIS — F324 Major depressive disorder, single episode, in partial remission: Secondary | ICD-10-CM

## 2021-04-13 DIAGNOSIS — I10 Essential (primary) hypertension: Secondary | ICD-10-CM

## 2021-04-13 DIAGNOSIS — F419 Anxiety disorder, unspecified: Secondary | ICD-10-CM

## 2021-04-13 NOTE — Progress Notes (Signed)
Chronic Care Management Pharmacy Note  04/13/2021 Name:  Cheryl Myers MRN:  [id number] DOB:  August 28, 1945  Summary: -Pleasant 75 year old who presents for initial CCM visit. 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 She was married for 59 years and she is frustrated because she's still very sad about him passing and it impacts her daily. Her daughter has MS and she is excited to go to the beach this weekend with her  Recommendations/Changes made from today's visit: -Patient started on Aripip last month but she stopped taking it. Would like to wait until visit next week in person to try a new medication  Subjective: Cheryl Myers is an 75 y.o. year old female who is a primary patient of Henrene Pastor, Zeb Comfort, MD.  The CCM team was consulted for assistance with disease management and care coordination needs.    Engaged with patient by telephone for initial visit in response to provider referral for pharmacy case management and/or care coordination services.   Consent to Services:  The patient was given information about Chronic Care Management services, agreed to services, and gave verbal consent prior to initiation of services.  Please see initial visit note for detailed documentation.   Patient Care Team: Lillard Anes, MD as PCP - General (Family Medicine) Lane Hacker, [facility] as Pharmacist (Pharmacist)  Recent office visits:  03-21-2021 Lillard Anes, MD. START abilify 2 mg by mouth at bedtime as needed. Completed course of amoxicillin, glucosamine-chondroitin, prednisone and vitamin D.   11-21-2020 Lillard Anes, MD. Positive covid test. START molnupiravir 200 mg take 800 mg twice daily for 5 days.   Recent consult visits:  None   Hospital visits:  None in previous 6 months   Objective:  Lab Results  Component Value Date   CREATININE 0.84 03/21/2021   BUN 11 03/21/2021   GFR 73.21 10/04/2016   GFRNONAA 71 03/20/2020    GFRAA 82 03/20/2020   NA 141 03/21/2021   K 4.3 03/21/2021   CALCIUM 9.7 03/21/2021   CO2 24 03/21/2021   GLUCOSE 79 03/21/2021    Lab Results  Component Value Date/Time   GFR 73.21 10/04/2016 08:59 AM    Last diabetic Eye exam: No results found for: HMDIABEYEEXA  Last diabetic Foot exam: No results found for: HMDIABFOOTEX   Lab Results  Component Value Date   CHOL 222 (H) 10/04/2016   HDL 61.60 10/04/2016   LDLCALC 144 (H) 10/04/2016   TRIG 82.0 10/04/2016   CHOLHDL 4 10/04/2016    Hepatic Function Latest Ref Rng & Units 03/21/2021 03/20/2020 10/04/2016  Total Protein 6.0 - 8.5 g/dL 6.5 6.6 6.7  Albumin 3.7 - 4.7 g/dL 4.4 4.7 4.2  AST 0 - 40 IU/L _0 ALT 0 - 32 IU/L _1 Alk Phosphatase 44 - 121 IU/L 71 78 66  Total Bilirubin 0.0 - 1.2 mg/dL 0.5 0.5 0.7    Lab Results  Component Value Date/Time   TSH 3.660 03/21/2021 02:12 PM   TSH 4.090 03/20/2020 11:32 AM    CBC Latest Ref Rng & Units 03/21/2021 03/20/2020 01/18/2018  WBC 3.4 - 10.8 x10E3/uL 3.7 4.3 4.3  Hemoglobin 11.1 - 15.9 g/dL 11.6 12.5 13.0  Hematocrit 34.0 - 46.6 % 35.4 38.3 39.1  Platelets 150 - 450 x10E3/uL 253 273 261    No results found for: VD25OH  Clinical ASCVD: No  The ASCVD Risk score (Arnett DK, et al., 2019) failed to  calculate for the following reasons:   Cannot find a previous HDL lab   Cannot find a previous total cholesterol lab    Depression screen [facility] 2/9 03/21/2021 03/20/2020 12/15/2019  Decreased Interest _0 Down, Depressed, Hopeless _1 PHQ - 2 Score _2 Altered sleeping _3 Tired, decreased energy 0 0 0  Change in appetite 1 1 0  Feeling bad or failure about yourself  2 0 2  Trouble concentrating 0 0 0  Moving slowly or fidgety/restless 0 0 3  Suicidal thoughts 0 0 0  PHQ-9 Score _4 Difficult doing work/chores Very difficult Very difficult Somewhat difficult     Other: (CHADS2VASc if Afib, MMRC or CAT for COPD, ACT, DEXA)  Social History    Tobacco Use  Smoking Status Never  Smokeless Tobacco Never   BP Readings from Last 3 Encounters:  03/21/21 (!) 150/80  11/21/20 122/80  07/03/20 (!) 149/80   Pulse Readings from Last 3 Encounters:  03/21/21 85  11/21/20 73  03/20/20 84   Wt Readings from Last 3 Encounters:  03/21/21 140 lb (63.5 kg)  07/03/20 137 lb (62.1 kg)  03/20/20 137 lb 3.2 oz (62.2 kg)   BMI Readings from Last 3 Encounters:  03/21/21 21.93 kg/m  07/03/20 21.46 kg/m  03/20/20 21.49 kg/m    Assessment/Interventions: Review of patient past medical history, allergies, medications, health status, including review of consultants reports, laboratory and other test data, was performed as part of comprehensive evaluation and provision of chronic care management services.   SDOH:  (Social Determinants of Health) assessments and interventions performed: Yes SDOH Interventions    Flowsheet Row Most Recent Value  SDOH Interventions   Financial Strain Interventions Intervention Not Indicated      SDOH Screenings   Alcohol Screen: Not on file  Depression (PHQ2-9): Medium Risk   PHQ-2 Score: 11  Financial Resource Strain: Low Risk    Difficulty of Paying Living Expenses: Not hard at all  Food Insecurity: Not on file  Housing: Not on file  Physical Activity: Not on file  Social Connections: Not on file  Stress: Not on file  Tobacco Use: Low Risk    Smoking Tobacco Use: Never   Smokeless Tobacco Use: Never  Transportation Needs: Not on file    [place]  No Known Allergies  Medications Reviewed Today     Reviewed by Lane Hacker, [facility] (Pharmacist) on 04/13/21 at [place] List Status: <None>   Medication Order Taking? Sig Documenting Provider Last Dose Status Informant  ALPRAZolam (XANAX) 0.5 MG tablet [id number] Yes TAKE 1 TABLET BY MOUTH TWICE DAILY AS NEEDED FOR ANXIETY. Lillard Anes, MD Taking Active   ARIPiprazole (ABILIFY) 2 MG tablet [id number] No Take 1 tablet (2 mg  total) by mouth at bedtime as needed.  Patient not taking: Reported on 04/13/2021   Lillard Anes, MD Not Taking Consider Medication Status and Discontinue   ibuprofen (ADVIL) 200 MG tablet [id number] No Take 400 mg by mouth every 6 (six) hours as needed (pain).   Patient not taking: Reported on 04/13/2021   [provider] Not Taking Active Self  imiquimod (ALDARA) 5 % cream [id number] No Apply topically.  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   HTN (hypertension), benign 09/26/2016   Anxiety 09/26/2016    Immunization History  Administered Date(s) Administered   Pneumococcal Polysaccharide-23 03/04/2017   Tdap 10/04/2016    Conditions to be addressed/monitored:  Depression  Care Plan : CCM Pharmacy Care Plan  Updates made by ,  K, RPH since 04/13/2021 12:00 AM     Problem: Mental Health   Priority: High  Onset Date: 04/13/2021     Long-Range Goal: Disease State Management   Start Date: 04/13/2021  Expected End Date: 04/13/2022  This Visit's Progress: On track  Priority: High  Note:   Current Barriers:  Does not maintain contact with provider office Does not contact provider office for questions/concerns  Pharmacist Clinical Goal(s):  Patient will contact provider office for questions/concerns as evidenced notation of same in electronic health record through  collaboration with PharmD and provider.   Interventions: 1:1 collaboration with Perry, Lawrence Edward, MD regarding development and update of comprehensive plan of care as evidenced by provider attestation and co-signature Inter-disciplinary care team collaboration (see longitudinal plan of care) Comprehensive medication review performed; medication list updated in electronic medical record  Depression/Anxiety  -Uncontrolled -Current treatment: Alprazolam 0.5mg BID PRN Paroxetine 40mg QD -Medications previously tried/failed: Aripiprazole (Made her "wired") -PHQ9:  Depression screen PHQ 2/9 03/21/2021 03/20/2020 12/15/2019  Decreased Interest 2 2 1  Down, Depressed, Hopeless 3 2 3  PHQ - 2 Score 5 4 4  Altered sleeping 3 1 3  Tired, decreased energy 0 0 0  Change in appetite 1 1 0  Feeling bad or failure about yourself  2 0 2  Trouble concentrating 0 0 0  Moving slowly or fidgety/restless 0 0 3  Suicidal thoughts 0 0 0  PHQ-9 Score 11 6 12  Difficult doing work/chores Very difficult Very difficult Somewhat difficult  -GAD7:  GAD 7 : Generalized Anxiety Score 09/20/2019  Nervous, Anxious, on Edge 3  Control/stop worrying 3  Worry too much - different things 3  Trouble relaxing 3  Restless 3  Easily annoyed or irritable 2  Afraid - awful might happen 3  Total GAD 7 Score 20  Anxiety Difficulty Very difficult  -Educated on Benefits of medication for symptom control October 2022: Was started on Aripip last month but it made her wired and she stopped taking it. Asked her about trying a new medication but she'd prefer to wait to see Dr. Perry in person before starting something new  Patient Goals/Self-Care Activities Patient will:  - take medications as prescribed  Follow Up Plan: The patient has been provided with contact information for the care management team and has been advised to call with any health related questions or concerns.   CPP F/U Jan 2023   ,  Pharm.D. - [phone]        Medication Assistance: None required.  Patient affirms current coverage meets needs.  Compliance/Adherence/Medication fill history: Star Rating Drugs:  None   Care Gaps: Last annual wellness visit? None Last eye exam / retinopathy screening? Patient states her last exam was about 6 years ago but she is working on getting an appointment Last diabetic foot exam?   None  Patient's preferred pharmacy is:  [place], [place] - [address] [place] [address] Phone: [phone] Fax: [phone]  Uses pill box? No - Not on enough meds Pt endorses Partial% compliance  We discussed: Benefits of medication synchronization, packaging and delivery as well as enhanced pharmacist oversight with [facility]. Patient decided to: Utilize [facility] for medication synchronization, packaging and delivery -Verbal consent obtained for [facility] enhanced pharmacy services (medication synchronization, adherence packaging, delivery coordination). A medication sync plan was created to allow patient to get all medications delivered once every 30 to 90 days per patient preference. Patient understands they have freedom to choose pharmacy and clinical pharmacist will coordinate care between all prescribers and [facility].   Care Plan and Follow Up Patient Decision:  Patient agrees to Care Plan and Follow-up.  Plan: The care management team will reach out to the patient again over the next 90 days.  CPP F/U Jan 2023  [name], Pharm.D. - [phone]

## 2021-04-13 NOTE — Patient Instructions (Signed)
Visit Information   Goals Addressed             This Visit's Progress    Manage My Medicine       Timeframe:  Long-Range Goal Priority:  High Start Date:                             Expected End Date:                       Follow Up Date 04/13/22   - call for medicine refill 2 or 3 days before it runs out - use a pillbox to sort medicine    Why is this important?   These steps will help you keep on track with your medicines.   Notes:      Track and Manage My Symptoms-Depression       Timeframe:  Long-Range Goal Priority:  High Start Date:                             Expected End Date:                       Follow Up Date 04/13/22   - avoid negative self-talk - spend time or talk with others every day - watch for early signs of feeling worse    Why is this important?   Keeping track of your progress will help your treatment team find the right mix of medicine and therapy for you.  Write in your journal every day.  Day-to-day changes in depression symptoms are normal. It may be more helpful to check your progress at the end of each week instead of every day.     Notes:        Patient Care Plan: CCM Pharmacy Care Plan     Problem Identified: Mental Health   Priority: High  Onset Date: 04/13/2021     Long-Range Goal: Disease State Management   Start Date: 04/13/2021  Expected End Date: 04/13/2022  This Visit's Progress: On track  Priority: High  Note:   Current Barriers:  Does not maintain contact with provider office Does not contact provider office for questions/concerns  Pharmacist Clinical Goal(s):  Patient will contact provider office for questions/concerns as evidenced notation of same in electronic health record through collaboration with PharmD and provider.   Interventions: 1:1 collaboration with Lillard Anes, MD regarding development and update of comprehensive plan of care as evidenced by provider attestation and  co-signature Inter-disciplinary care team collaboration (see longitudinal plan of care) Comprehensive medication review performed; medication list updated in electronic medical record  Depression/Anxiety  -Uncontrolled -Current treatment: Alprazolam 0.5mg  BID PRN Paroxetine 40mg  QD -Medications previously tried/failed: Aripiprazole (Made her "wired") -PHQ9:  Depression screen [facility] 2/9 03/21/2021 03/20/2020 12/15/2019  Decreased Interest 2 2 1   Down, Depressed, Hopeless 3 2 3   PHQ - 2 Score 5 4 4   Altered sleeping 3 1 3   Tired, decreased energy 0 0 0  Change in appetite 1 1 0  Feeling bad or failure about yourself  2 0 2  Trouble concentrating 0 0 0  Moving slowly or fidgety/restless 0 0 3  Suicidal thoughts 0 0 0  PHQ-9 Score 11 6 12   Difficult doing work/chores Very difficult Very difficult Somewhat difficult  -GAD7:  GAD 7 : Generalized Anxiety Score 09/20/2019  Nervous, Anxious,  on Edge 3  Control/stop worrying 3  Worry too much - different things 3  Trouble relaxing 3  Restless 3  Easily annoyed or irritable 2  Afraid - awful might happen 3  Total GAD 7 Score 20  Anxiety Difficulty Very difficult  -Educated on Benefits of medication for symptom control October 2022: Was started on Aripip last month but it made her wired and she stopped taking it. Asked her about trying a new medication but she'd prefer to wait to see Dr. Henrene Pastor in person before starting something new  Patient Goals/Self-Care Activities Patient will:  - take medications as prescribed  Follow Up Plan: The patient has been provided with contact information for the care management team and has been advised to call with any health related questions or concerns.   CPP F/U Jan 2023  [name], Pharm.D. - [phone]       Ms. Pomerleau was given information about Chronic Care Management services today including:  CCM service includes personalized support from designated clinical staff supervised by her  physician, including individualized plan of care and coordination with other care providers 24/7 contact phone numbers for assistance for urgent and routine care needs. Standard insurance, coinsurance, copays and deductibles apply for chronic care management only during months in which we provide at least 20 minutes of these services. Most insurances cover these services at 100%, however patients may be responsible for any copay, coinsurance and/or deductible if applicable. This service may help you avoid the need for more expensive face-to-face services. Only one practitioner may furnish and bill the service in a calendar month. The patient may stop CCM services at any time (effective at the end of the month) by phone call to the office staff.  Patient agreed to services and verbal consent obtained.   The patient verbalized understanding of instructions, educational materials, and care plan provided today and declined offer to receive copy of patient instructions, educational materials, and care plan.  The pharmacy team will reach out to the patient again over the next 90 days.   Lane Hacker, [facility]

## 2021-04-19 ENCOUNTER — Other Ambulatory Visit: Payer: Self-pay | Admitting: Legal Medicine

## 2021-04-19 DIAGNOSIS — F4321 Adjustment disorder with depressed mood: Secondary | ICD-10-CM

## 2021-04-26 ENCOUNTER — Other Ambulatory Visit: Payer: Self-pay

## 2021-04-26 ENCOUNTER — Encounter: Payer: Self-pay | Admitting: Legal Medicine

## 2021-04-26 ENCOUNTER — Ambulatory Visit (INDEPENDENT_AMBULATORY_CARE_PROVIDER_SITE_OTHER): Payer: Medicare Other | Admitting: Legal Medicine

## 2021-04-26 VITALS — BP 146/100 | HR 74 | Temp 97.6°F | Resp 15 | Ht 67.0 in | Wt 141.0 lb

## 2021-04-26 DIAGNOSIS — F324 Major depressive disorder, single episode, in partial remission: Secondary | ICD-10-CM | POA: Diagnosis not present

## 2021-04-26 MED ORDER — ARIPIPRAZOLE 10 MG PO TABS: 10.0000 mg | ORAL_TABLET | Freq: Every evening | ORAL | 3 refills | Status: DC | PRN

## 2021-04-26 NOTE — Progress Notes (Signed)
Subjective:  Patient ID: Cheryl Myers, female    DOB: 15-Feb-1946  Age: 75 y.o. MRN: [id number]  Chief Complaint  Patient presents with   Depression    HPI: chronic visit  This patient has major depression for 1 year.  PHQ9 =13.  Patient is having less anhedonia.  The patient has improved future plans and prospects.  The depression is worse with deathh in family.  The patient is not exercising and working on behavior to improve mental health.  Patient is not  seeing a therapist or psychiatrist.  na  Patient is on paroxetine and aripiprazole.  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 60 tablet 0   ibuprofen (ADVIL) 200 MG tablet Take 400 mg by mouth every 6 (six) hours as needed (pain).     PARoxetine (PAXIL) 40 MG tablet TAKE 1 TABLET BY MOUTH EVERY MORNING 90 tablet 2   No current facility-administered medications on file prior to visit.   Past Medical History:  Diagnosis Date   Anxiety    Cancer ([place])    Basal and squamous cell skin cancer   Osteopenia 08/2014   T score -1.9 FRAX 11%/1.8%   Past Surgical History:  Procedure Laterality Date   Basal and squamous cell skin cancers excised      OVARIAN CYST REMOVAL      Family History  Problem Relation Age of Onset   Diabetes Mother    Alzheimer's disease Mother    Melanoma Brother    Diabetes Maternal Aunt    Melanoma Maternal Uncle    Diabetes Maternal Uncle    Heart disease Maternal Uncle    Melanoma Maternal Uncle    Heart disease Maternal Uncle    Social History   Socioeconomic History   Marital status: Married    Spouse name: Not on file   Number of children: Not on file   Years of education: Not on file   Highest education level: Not on file  Occupational History   Not on file  Tobacco Use   Smoking status: Never   Smokeless tobacco: Never   Vaping Use   Vaping Use: Never used  Substance and Sexual Activity   Alcohol use: Yes    Alcohol/week: 1.0 standard drink    Types: 1 Glasses of wine per week    Comment: socially   Drug use: No   Sexual activity: Not Currently    Birth control/protection: Post-menopausal    Comment: 1st intercourse 61 yo--1 partner  Other Topics Concern   Not on file  Social History Narrative   Not on file   Social Determinants of Health   Financial Resource Strain: Low Risk    Difficulty of Paying Living Expenses: Not hard at all  Food Insecurity: Not on file  Transportation Needs: Not on file  Physical Activity: Not on file  Stress: Not on file  Social Connections: Not on file    Review of Systems  Constitutional:  Negative for chills, fatigue and fever.  HENT:  Negative for congestion, ear pain and sore throat.   Respiratory:  Negative for cough and shortness of breath.   Cardiovascular:  Negative for chest pain and palpitations.  Gastrointestinal:  Negative for abdominal pain, constipation, diarrhea, nausea and vomiting.  Endocrine: Negative for polydipsia, polyphagia and polyuria.  Genitourinary:  Negative for difficulty urinating and dysuria.  Musculoskeletal:  Negative for arthralgias, back pain and myalgias.  Skin:  Negative for rash.  Neurological:  Positive for headaches. Negative for dizziness, weakness and numbness.  Psychiatric/Behavioral:  Negative for dysphoric mood. The patient is not nervous/anxious.     Objective:  BP (!) 146/100   Pulse 74   Temp 97.6 F (36.4 C)   Resp 15   Ht 5\' 7"  (1.702 m)   Wt 141 lb (64 kg)   SpO2 98%   BMI 22.08 kg/m   BP/Weight 04/26/2021 03/21/2021 9/47/0962  Systolic BP 836 629 476  Diastolic BP 546 80 80  Wt. (Lbs) 141 140 -  BMI 22.08 21.93 -    Physical Exam Vitals reviewed.  Constitutional:      General: She is not in acute distress.    Appearance: Normal appearance.  HENT:     Right Ear: Tympanic membrane, ear canal  and external ear normal.     Left Ear: Tympanic membrane, ear canal and external ear normal.     Mouth/Throat:     Mouth: Mucous membranes are moist.     Pharynx: Oropharynx is clear.  Eyes:     Extraocular Movements: Extraocular movements intact.     Conjunctiva/sclera: Conjunctivae normal.     Pupils: Pupils are equal, round, and reactive to light.  Cardiovascular:     Rate and Rhythm: Normal rate and regular rhythm.     Pulses: Normal pulses.     Heart sounds: Normal heart sounds. No murmur heard.   No gallop.  Pulmonary:     Effort: Pulmonary effort is normal. No respiratory distress.     Breath sounds: Normal breath sounds. No wheezing.  Abdominal:     General: Abdomen is flat. Bowel sounds are normal. There is no distension.     Palpations: Abdomen is soft.     Tenderness: There is no abdominal tenderness.  Musculoskeletal:        General: Normal range of motion.     Cervical back: Normal range of motion and neck supple.     Right lower leg: No edema.     Left lower leg: No edema.  Skin:    General: Skin is warm and dry.     Capillary Refill: Capillary refill takes less than 2 seconds.  Neurological:     General: No focal deficit present.     Mental Status: She is alert and oriented to person, place, and time. Mental status is at baseline.  Psychiatric:        Mood and Affect: Mood normal.        Thought Content: Thought content normal.        Lab Results  Component Value Date   WBC 3.7 03/21/2021   HGB 11.6 03/21/2021   HCT 35.4 03/21/2021   PLT 253 03/21/2021   GLUCOSE 79 03/21/2021   CHOL 222 (H) 10/04/2016   TRIG 82.0 10/04/2016   HDL 61.60 10/04/2016   LDLCALC 144 (H) 10/04/2016   ALT 13 03/21/2021   AST 16 03/21/2021   NA 141 03/21/2021   K 4.3 03/21/2021   CL 103 03/21/2021   CREATININE 0.84 03/21/2021   BUN 11 03/21/2021   CO2 24 03/21/2021   TSH 3.660 03/21/2021   Depression screen PHQ 2/9 04/26/2021 03/21/2021 03/20/2020 12/15/2019 09/20/2019   Decreased Interest 2 2 2 1 3   Down, Depressed, Hopeless 3 3 2 3 3   PHQ - 2 Score 5 5 4  4 6  Altered sleeping 3 3 1 3 3   Tired, decreased energy 1 0 0 0 2  Change in appetite 1 1 1  0 2  Feeling bad or failure about yourself  2 2 0 2 1  Trouble concentrating 0 0 0 0 0  Moving slowly or fidgety/restless 0 0 0 3 0  Suicidal thoughts 1 0 0 0 0  PHQ-9 Score 13 11 6 12 14   Difficult doing work/chores Somewhat difficult Very difficult Very difficult Somewhat difficult Very difficult      Assessment & Plan:   Problem List Items Addressed This Visit       Other   Depression, major, single episode, in partial remission ([place]) - Primary   Relevant Medications   ARIPiprazole (ABILIFY) 10 MG tablet  .patient is doing better but needs increased dose of aripiprazole to assist sleep       Follow-up: Return in about 1 month (around 05/27/2021) for depression.  An After Visit Summary was printed and given to the patient.  Reinaldo Meeker, MD [facility] [phone]

## 2021-04-30 DIAGNOSIS — I10 Essential (primary) hypertension: Secondary | ICD-10-CM

## 2021-04-30 DIAGNOSIS — E782 Mixed hyperlipidemia: Secondary | ICD-10-CM | POA: Diagnosis not present

## 2021-04-30 DIAGNOSIS — F324 Major depressive disorder, single episode, in partial remission: Secondary | ICD-10-CM

## 2021-05-23 ENCOUNTER — Telehealth: Payer: Self-pay

## 2021-05-23 NOTE — Progress Notes (Signed)
    Chronic Care Management Pharmacy Assistant   Name: Cheryl Myers  MRN: [id number] DOB: Jan 21, 1946   Reason for Encounter: Disease State/General Adherence Call   Conditions to be addressed/monitored: Depression   Recent office visits:  04/26/2021-Perry, Zeb Comfort, MD-(PCP) Depression, major, single episode, in partial remission (HCC)Depression, major, single episode, in partial remission ([place])- started Aripiprazole 10 mg at bedtime prn. Follow up in 1 month    Recent consult visits:  None  Hospital visits:  None in previous 6 months  [place] for general disease state and medication adherence call.   Patient is not > 5 days past due for refill on the following medications per chart history.    What concerns do you have about your medications? She took the first dose of Abilify 10 mg she felt weird, and anxious. She stopped medication she has appt with Dr. Henrene Pastor on 05/28/21.  The patient reports the following side effects with her medications. Weird feeling and  anxious  How often do you forget or accidentally miss a dose? Patient discontinued due to side effects.  Do you use a pillbox? No  Are you having any problems getting your medications from your pharmacy? No  Has the cost of your medications been a concern? No If yes, what medication and is patient assistance available or has it been applied for?  Since last visit with [facility], the following interventions have been made: During 04/26/21 visit with Dr. Henrene Pastor Abilify was increased to 10mg  at bedtime.  The patient has not had an ED visit since last contact.   The patient denies problems with their health.   she reports the following  concerns or questions for [name], at this time.  Patient crying and concerned about her depression and anxiety due to losing her husband and medications not working.      Medications: Outpatient Encounter Medications as of 05/23/2021  Medication Sig    ALPRAZolam (XANAX) 0.5 MG tablet TAKE 1 TABLET BY MOUTH TWICE DAILY AS NEEDED FOR ANXIETY.   ARIPiprazole (ABILIFY) 10 MG tablet Take 1 tablet (10 mg total) by mouth at bedtime as needed.   ibuprofen (ADVIL) 200 MG tablet Take 400 mg by mouth every 6 (six) hours as needed (pain).   PARoxetine (PAXIL) 40 MG tablet TAKE 1 TABLET BY MOUTH EVERY MORNING   No facility-administered encounter medications on file as of 05/23/2021.    Care Gaps:  Never Done Hepatitis C Screening (Once)  Never Done Zoster Vaccines- Shingrix (1 of 2) Mar 04 2018 Pneumonia Vaccine 54+ Years old (2 - PCV) Last completed: Mar 04, 2017 May 07 2020 MAMMOGRAM (Every 2 Years) Last completed: May 07, 2018 Never Done INFLUENZA VACCINE (Every 8 Months, August to March)   Upcoming   May 12 2022 COVID-19 Vaccine (1) Postponed by Thompson Caul I, [place] (Patient Declined) Feb 22 2025 COLONOSCOPY (Pts 45-38yrs Insurance coverage will need to be confirmed) (Every 10 Years) Last completed: Feb 23, 2015 Oct 05 2026 TETANUS/TDAP (Every 10 Years) Last completed: Oct 04, 2016   Star Rating Drugs: None    [employer] [phone]

## 2021-05-28 ENCOUNTER — Encounter: Payer: Self-pay | Admitting: Legal Medicine

## 2021-05-28 ENCOUNTER — Ambulatory Visit (INDEPENDENT_AMBULATORY_CARE_PROVIDER_SITE_OTHER): Payer: Medicare Other | Admitting: Legal Medicine

## 2021-05-28 ENCOUNTER — Other Ambulatory Visit: Payer: Self-pay

## 2021-05-28 VITALS — HR 93 | Temp 97.0°F | Resp 15 | Ht 67.0 in | Wt 138.0 lb

## 2021-05-28 DIAGNOSIS — F324 Major depressive disorder, single episode, in partial remission: Secondary | ICD-10-CM

## 2021-05-28 MED ORDER — LATUDA 20 MG PO TABS: 20.0000 mg | ORAL_TABLET | Freq: Two times a day (BID) | ORAL | 2 refills | Status: DC | PRN

## 2021-05-28 NOTE — Progress Notes (Signed)
Subjective:  Patient ID: Cheryl Myers, female    DOB: 27-May-1946  Age: 75 y.o. MRN: [id number]  Chief Complaint  Patient presents with   Depression    HPI: continued depression. PHQ 9 is 13.  She stopped aripiprazole kept her awake.  She is is in anniverary depression with husband diet.   Current Outpatient Medications on File Prior to Visit  Medication Sig Dispense Refill   ALPRAZolam (XANAX) 0.5 MG tablet TAKE 1 TABLET BY MOUTH TWICE DAILY AS NEEDED FOR ANXIETY. 60 tablet 0   ibuprofen (ADVIL) 200 MG tablet Take 400 mg by mouth every 6 (six) hours as needed (pain).     PARoxetine (PAXIL) 40 MG tablet TAKE 1 TABLET BY MOUTH EVERY MORNING 90 tablet 2   No current facility-administered medications on file prior to visit.   Past Medical History:  Diagnosis Date   Anxiety    Cancer ([place])    Basal and squamous cell skin cancer   Osteopenia 08/2014   T score -1.9 FRAX 11%/1.8%   Past Surgical History:  Procedure Laterality Date   Basal and squamous cell skin cancers excised      OVARIAN CYST REMOVAL      Family History  Problem Relation Age of Onset   Diabetes Mother    Alzheimer's disease Mother    Melanoma Brother    Diabetes Maternal Aunt    Melanoma Maternal Uncle    Diabetes Maternal Uncle    Heart disease Maternal Uncle    Melanoma Maternal Uncle    Heart disease Maternal Uncle    Social History   Socioeconomic History   Marital status: Married    Spouse name: Not on file   Number of children: Not on file   Years of education: Not on file   Highest education level: Not on file  Occupational History   Not on file  Tobacco Use   Smoking status: Never   Smokeless tobacco: Never  Vaping Use   Vaping Use: Never used  Substance and Sexual Activity   Alcohol use: Yes    Alcohol/week: 1.0 standard drink    Types: 1 Glasses of wine per week    Comment: socially   Drug use: No   Sexual activity: Not Currently    Birth control/protection: Post-menopausal     Comment: 1st intercourse 30 yo--1 partner  Other Topics Concern   Not on file  Social History Narrative   Not on file   Social Determinants of Health   Financial Resource Strain: Low Risk    Difficulty of Paying Living Expenses: Not hard at all  Food Insecurity: Not on file  Transportation Needs: Not on file  Physical Activity: Not on file  Stress: Not on file  Social Connections: Not on file    Review of Systems  Constitutional:  Negative for chills, fatigue and fever.  HENT:  Negative for congestion, ear pain and sore throat.   Respiratory:  Negative for cough and shortness of breath.   Cardiovascular:  Negative for chest pain and palpitations.  Gastrointestinal:  Negative for abdominal pain, constipation, diarrhea, nausea and vomiting.  Endocrine: Negative for polydipsia, polyphagia and polyuria.  Genitourinary:  Negative for difficulty urinating and dysuria.  Musculoskeletal:  Negative for arthralgias, back pain and myalgias.  Skin:  Negative for rash.  Neurological:  Negative for headaches.  Psychiatric/Behavioral:  Positive for sleep disturbance. Negative for dysphoric mood and suicidal ideas. The patient is not nervous/anxious.  Crying spells  Depression screen [facility] 2/9 04/26/2021 03/21/2021 03/20/2020 12/15/2019 09/20/2019  Decreased Interest 2 2 2 1 3   Down, Depressed, Hopeless 3 3 2 3 3   PHQ - 2 Score 5 5 4 4 6   Altered sleeping 3 3 1 3 3   Tired, decreased energy 1 0 0 0 2  Change in appetite 1 1 1  0 2  Feeling bad or failure about yourself  2 2 0 2 1  Trouble concentrating 0 0 0 0 0  Moving slowly or fidgety/restless 0 0 0 3 0  Suicidal thoughts 1 0 0 0 0  PHQ-9 Score 13 11 6 12 14   Difficult doing work/chores Somewhat difficult Very difficult Very difficult Somewhat difficult Very difficult     Objective:  Pulse 93   Temp (!) 97 F (36.1 C)   Resp 15   Ht 5\' 7"  (1.702 m)   Wt 138 lb (62.6 kg)   SpO2 98%   BMI 21.61 kg/m   BP/Weight 05/28/2021  04/26/2021 1/60/1093  Systolic BP - 235 573  Diastolic BP - 220 80  Wt. (Lbs) 138 141 140  BMI 21.61 22.08 21.93    Physical Exam Vitals reviewed.  Constitutional:      Appearance: Normal appearance.  HENT:     Right Ear: Tympanic membrane normal.     Left Ear: Tympanic membrane normal.     Mouth/Throat:     Mouth: Mucous membranes are moist.  Eyes:     Extraocular Movements: Extraocular movements intact.     Conjunctiva/sclera: Conjunctivae normal.     Pupils: Pupils are equal, round, and reactive to light.  Cardiovascular:     Rate and Rhythm: Normal rate and regular rhythm.     Pulses: Normal pulses.     Heart sounds: Normal heart sounds. No murmur heard.   No gallop.  Pulmonary:     Effort: Pulmonary effort is normal. No respiratory distress.     Breath sounds: Normal breath sounds. No wheezing.  Abdominal:     General: Abdomen is flat. Bowel sounds are normal. There is no distension.     Tenderness: There is no abdominal tenderness.  Musculoskeletal:        General: Normal range of motion.     Right lower leg: No edema.     Left lower leg: No edema.  Skin:    General: Skin is warm.     Capillary Refill: Capillary refill takes less than 2 seconds.  Neurological:     General: No focal deficit present.     Mental Status: She is alert and oriented to person, place, and time. Mental status is at baseline.        Lab Results  Component Value Date   WBC 3.7 03/21/2021   HGB 11.6 03/21/2021   HCT 35.4 03/21/2021   PLT 253 03/21/2021   GLUCOSE 79 03/21/2021   CHOL 222 (H) 10/04/2016   TRIG 82.0 10/04/2016   HDL 61.60 10/04/2016   LDLCALC 144 (H) 10/04/2016   ALT 13 03/21/2021   AST 16 03/21/2021   NA 141 03/21/2021   K 4.3 03/21/2021   CL 103 03/21/2021   CREATININE 0.84 03/21/2021   BUN 11 03/21/2021   CO2 24 03/21/2021   TSH 3.660 03/21/2021      Assessment & Plan:   Problem List Items Addressed This Visit       Other   Depression, major,  single episode, in partial remission ([place]) - Primary  Relevant Medications   lurasidone (LATUDA) 20 MG TABS tablet  .depression still doing poorly, add latuda, refer to [facility] counseling         Follow-up: Return in about 2 months (around 07/28/2021) for depression.  An After Visit Summary was printed and given to the patient.  Reinaldo Meeker, MD [facility] [phone]

## 2021-06-01 ENCOUNTER — Other Ambulatory Visit: Payer: Self-pay

## 2021-06-01 DIAGNOSIS — F4321 Adjustment disorder with depressed mood: Secondary | ICD-10-CM

## 2021-06-01 MED ORDER — ALPRAZOLAM 0.5 MG PO TABS: 0.5000 mg | ORAL_TABLET | Freq: Two times a day (BID) | ORAL | 3 refills | Status: DC | PRN

## 2021-06-04 ENCOUNTER — Other Ambulatory Visit: Payer: Self-pay

## 2021-06-04 ENCOUNTER — Other Ambulatory Visit: Payer: Self-pay | Admitting: Legal Medicine

## 2021-06-04 DIAGNOSIS — F324 Major depressive disorder, single episode, in partial remission: Secondary | ICD-10-CM

## 2021-06-04 MED ORDER — LATUDA 20 MG PO TABS: 20.0000 mg | ORAL_TABLET | Freq: Once | ORAL | 2 refills | Status: DC

## 2021-06-04 MED ORDER — LATUDA 20 MG PO TABS: 20.0000 mg | ORAL_TABLET | Freq: Every day | ORAL | 2 refills | Status: DC

## 2021-06-05 ENCOUNTER — Other Ambulatory Visit: Payer: Self-pay | Admitting: Legal Medicine

## 2021-06-05 DIAGNOSIS — F324 Major depressive disorder, single episode, in partial remission: Secondary | ICD-10-CM

## 2021-06-05 MED ORDER — LAMOTRIGINE 25 MG PO TABS: 25.0000 mg | ORAL_TABLET | Freq: Every day | ORAL | 3 refills | Status: DC

## 2021-06-14 DIAGNOSIS — Z8249 Family history of ischemic heart disease and other diseases of the circulatory system: Secondary | ICD-10-CM | POA: Diagnosis not present

## 2021-06-14 DIAGNOSIS — F411 Generalized anxiety disorder: Secondary | ICD-10-CM | POA: Diagnosis present

## 2021-06-14 DIAGNOSIS — R42 Dizziness and giddiness: Secondary | ICD-10-CM | POA: Diagnosis not present

## 2021-06-14 DIAGNOSIS — I639 Cerebral infarction, unspecified: Secondary | ICD-10-CM | POA: Diagnosis not present

## 2021-06-14 DIAGNOSIS — G2579 Other drug induced movement disorders: Secondary | ICD-10-CM | POA: Diagnosis not present

## 2021-06-14 DIAGNOSIS — Z820 Family history of epilepsy and other diseases of the nervous system: Secondary | ICD-10-CM | POA: Diagnosis not present

## 2021-06-14 DIAGNOSIS — R251 Tremor, unspecified: Secondary | ICD-10-CM | POA: Diagnosis not present

## 2021-06-14 DIAGNOSIS — F8081 Childhood onset fluency disorder: Secondary | ICD-10-CM | POA: Diagnosis not present

## 2021-06-14 DIAGNOSIS — F4381 Prolonged grief disorder: Secondary | ICD-10-CM | POA: Diagnosis present

## 2021-06-14 DIAGNOSIS — I6522 Occlusion and stenosis of left carotid artery: Secondary | ICD-10-CM | POA: Diagnosis not present

## 2021-06-14 DIAGNOSIS — F419 Anxiety disorder, unspecified: Secondary | ICD-10-CM | POA: Diagnosis not present

## 2021-06-14 DIAGNOSIS — Z79899 Other long term (current) drug therapy: Secondary | ICD-10-CM | POA: Diagnosis not present

## 2021-06-14 DIAGNOSIS — F604 Histrionic personality disorder: Secondary | ICD-10-CM | POA: Diagnosis not present

## 2021-06-14 DIAGNOSIS — R4781 Slurred speech: Secondary | ICD-10-CM | POA: Diagnosis not present

## 2021-06-14 DIAGNOSIS — R292 Abnormal reflex: Secondary | ICD-10-CM | POA: Diagnosis not present

## 2021-06-14 DIAGNOSIS — F32A Depression, unspecified: Secondary | ICD-10-CM | POA: Diagnosis not present

## 2021-06-14 DIAGNOSIS — H818X3 Other disorders of vestibular function, bilateral: Secondary | ICD-10-CM | POA: Diagnosis not present

## 2021-06-14 DIAGNOSIS — I10 Essential (primary) hypertension: Secondary | ICD-10-CM | POA: Diagnosis not present

## 2021-06-14 DIAGNOSIS — Z9151 Personal history of suicidal behavior: Secondary | ICD-10-CM | POA: Diagnosis not present

## 2021-06-14 DIAGNOSIS — I160 Hypertensive urgency: Secondary | ICD-10-CM | POA: Diagnosis present

## 2021-06-18 DIAGNOSIS — F32A Depression, unspecified: Secondary | ICD-10-CM | POA: Diagnosis not present

## 2021-06-18 DIAGNOSIS — I10 Essential (primary) hypertension: Secondary | ICD-10-CM | POA: Diagnosis not present

## 2021-06-18 DIAGNOSIS — I161 Hypertensive emergency: Secondary | ICD-10-CM | POA: Diagnosis not present

## 2021-06-18 DIAGNOSIS — F419 Anxiety disorder, unspecified: Secondary | ICD-10-CM | POA: Diagnosis not present

## 2021-06-18 DIAGNOSIS — H8110 Benign paroxysmal vertigo, unspecified ear: Secondary | ICD-10-CM | POA: Diagnosis not present

## 2021-06-20 ENCOUNTER — Ambulatory Visit (INDEPENDENT_AMBULATORY_CARE_PROVIDER_SITE_OTHER): Payer: Medicare Other | Admitting: Legal Medicine

## 2021-06-20 ENCOUNTER — Encounter: Payer: Self-pay | Admitting: Legal Medicine

## 2021-06-20 ENCOUNTER — Other Ambulatory Visit: Payer: Self-pay

## 2021-06-20 VITALS — BP 136/80 | HR 74 | Temp 97.9°F | Resp 16 | Ht 67.0 in | Wt 141.0 lb

## 2021-06-20 DIAGNOSIS — F324 Major depressive disorder, single episode, in partial remission: Secondary | ICD-10-CM | POA: Diagnosis not present

## 2021-06-20 NOTE — Progress Notes (Signed)
Subjective:  Patient ID: Cheryl Myers, female    DOB: 11-30-1945  Age: 75 y.o. MRN: [id number]  Chief Complaint  Patient presents with   Transitions Of Care   Hypertension   Anxiety    HPI: transition of care and reconciliation of medicines  Patient admitted  for confusion and paxil stopped.  CT normal.  Her thinking is clear.  She remains depressed.  Daughter with her.  Admitted 06/14/2021 and discharged 06/15/2021.   She is back to normal.  We discussed 2mg  abilify and counseling.   Current Outpatient Medications on File Prior to Visit  Medication Sig Dispense Refill   ALPRAZolam (XANAX) 0.5 MG tablet Take 1 tablet (0.5 mg total) by mouth 2 (two) times daily as needed. for anxiety 60 tablet 3   ibuprofen (ADVIL) 200 MG tablet Take 400 mg by mouth every 6 (six) hours as needed (pain).     lisinopril (ZESTRIL) 10 MG tablet Take by mouth.     meclizine (ANTIVERT) 12.5 MG tablet Take by mouth.     PARoxetine (PAXIL) 40 MG tablet TAKE 1 TABLET BY MOUTH EVERY MORNING (Patient taking differently: Take 20 mg by mouth daily.) 90 tablet 2   No current facility-administered medications on file prior to visit.   Past Medical History:  Diagnosis Date   Anxiety    Cancer ([place])    Basal and squamous cell skin cancer   Osteopenia 08/2014   T score -1.9 FRAX 11%/1.8%   Past Surgical History:  Procedure Laterality Date   Basal and squamous cell skin cancers excised      OVARIAN CYST REMOVAL      Family History  Problem Relation Age of Onset   Diabetes Mother    Alzheimer's disease Mother    Melanoma Brother    Diabetes Maternal Aunt    Melanoma Maternal Uncle    Diabetes Maternal Uncle    Heart disease Maternal Uncle    Melanoma Maternal Uncle    Heart disease Maternal Uncle    Social History   Socioeconomic History   Marital status: Married    Spouse name: Not on file   Number of children: Not on file   Years of education: Not on file   Highest education level: Not on  file  Occupational History   Not on file  Tobacco Use   Smoking status: Never   Smokeless tobacco: Never  Vaping Use   Vaping Use: Never used  Substance and Sexual Activity   Alcohol use: Yes    Alcohol/week: 1.0 standard drink    Types: 1 Glasses of wine per week    Comment: socially   Drug use: No   Sexual activity: Not Currently    Birth control/protection: Post-menopausal    Comment: 1st intercourse 67 yo--1 partner  Other Topics Concern   Not on file  Social History Narrative   Not on file   Social Determinants of Health   Financial Resource Strain: Low Risk    Difficulty of Paying Living Expenses: Not hard at all  Food Insecurity: Not on file  Transportation Needs: Not on file  Physical Activity: Not on file  Stress: Not on file  Social Connections: Not on file    Review of Systems  Constitutional:  Negative for chills, fatigue and fever.  HENT:  Negative for congestion, ear pain and sore throat.   Respiratory:  Negative for cough and shortness of breath.   Cardiovascular:  Negative for chest pain and palpitations.  Gastrointestinal:  Negative for abdominal pain, constipation, diarrhea, nausea and vomiting.  Endocrine: Negative for polydipsia, polyphagia and polyuria.  Genitourinary:  Negative for difficulty urinating and dysuria.  Musculoskeletal:  Negative for arthralgias, back pain and myalgias.  Skin:  Negative for rash.  Neurological:  Negative for headaches.  Psychiatric/Behavioral:  Negative for dysphoric mood. The patient is not nervous/anxious.     Objective:  BP 136/80    Pulse 74    Temp 97.9 F (36.6 C)    Resp 16    Ht 5\' 7"  (1.702 m)    Wt 141 lb (64 kg)    SpO2 98%    BMI 22.08 kg/m   BP/Weight 06/20/2021 05/28/2021 16/04/9603  Systolic BP 540 - 981  Diastolic BP 80 - 191  Wt. (Lbs) 141 138 141  BMI 22.08 21.61 22.08    Physical Exam Vitals reviewed.  Constitutional:      Appearance: Normal appearance.  Cardiovascular:     Rate and  Rhythm: Normal rate and regular rhythm.     Pulses: Normal pulses.     Heart sounds: Normal heart sounds. No murmur heard.   No gallop.  Pulmonary:     Effort: Pulmonary effort is normal. No respiratory distress.     Breath sounds: Normal breath sounds. No wheezing.  Skin:    Capillary Refill: Capillary refill takes less than 2 seconds.  Neurological:     General: No focal deficit present.     Mental Status: She is alert and oriented to person, place, and time.        Lab Results  Component Value Date   WBC 3.7 03/21/2021   HGB 11.6 03/21/2021   HCT 35.4 03/21/2021   PLT 253 03/21/2021   GLUCOSE 79 03/21/2021   CHOL 222 (H) 10/04/2016   TRIG 82.0 10/04/2016   HDL 61.60 10/04/2016   LDLCALC 144 (H) 10/04/2016   ALT 13 03/21/2021   AST 16 03/21/2021   NA 141 03/21/2021   K 4.3 03/21/2021   CL 103 03/21/2021   CREATININE 0.84 03/21/2021   BUN 11 03/21/2021   CO2 24 03/21/2021   TSH 3.660 03/21/2021      Assessment & Plan:   Diagnoses and all orders for this visit: Depression, major, single episode, in partial remission ([place])  Restart abilify 2mg  qd I gave list of counselors to her We discussed depression at length .       Follow-up: Return for regular appointment.  An After Visit Summary was printed and given to the patient.  Reinaldo Meeker, MD [facility] [phone]

## 2021-07-04 ENCOUNTER — Other Ambulatory Visit: Payer: Self-pay

## 2021-07-04 DIAGNOSIS — F4321 Adjustment disorder with depressed mood: Secondary | ICD-10-CM

## 2021-07-04 MED ORDER — ALPRAZOLAM 0.5 MG PO TABS: 0.5000 mg | ORAL_TABLET | Freq: Two times a day (BID) | ORAL | 3 refills | Status: DC | PRN

## 2021-07-04 MED ORDER — PAROXETINE HCL 40 MG PO TABS: 20.0000 mg | ORAL_TABLET | Freq: Every day | ORAL | 0 refills | Status: DC

## 2021-07-20 ENCOUNTER — Telehealth: Payer: Self-pay

## 2021-07-20 NOTE — Progress Notes (Signed)
° ° °  Chronic Care Management Pharmacy Assistant   Name: Cheryl Myers  MRN: [id number] DOB: Jul 09, 1945   Reason for Encounter: Follow up  07/20/21- I called pt to see if she wanting to keep [facility] as her pharmacy or stay with [facility].   Pt advised that for now she is comfortable staying with [facility] and is aware of the benefits through [facility]. Pt stated if something changes she will let us know and appreciated our call.   Medications: Outpatient Encounter Medications as of 07/20/2021  Medication Sig   ALPRAZolam (XANAX) 0.5 MG tablet Take 1 tablet (0.5 mg total) by mouth 2 (two) times daily as needed. for anxiety   ibuprofen (ADVIL) 200 MG tablet Take 400 mg by mouth every 6 (six) hours as needed (pain).   lisinopril (ZESTRIL) 10 MG tablet Take by mouth.   meclizine (ANTIVERT) 12.5 MG tablet Take by mouth.   PARoxetine (PAXIL) 40 MG tablet Take 0.5 tablets (20 mg total) by mouth daily.   No facility-administered encounter medications on file as of 07/20/2021.    Elray Mcgregor, [place] Pharmacist Assistant  [phone]

## 2021-07-27 ENCOUNTER — Ambulatory Visit (INDEPENDENT_AMBULATORY_CARE_PROVIDER_SITE_OTHER): Payer: Medicare Other | Admitting: Legal Medicine

## 2021-07-27 ENCOUNTER — Other Ambulatory Visit: Payer: Self-pay

## 2021-07-27 ENCOUNTER — Encounter: Payer: Self-pay | Admitting: Legal Medicine

## 2021-07-27 VITALS — BP 148/90 | HR 79 | Temp 98.5°F | Resp 15 | Ht 67.0 in | Wt 143.0 lb

## 2021-07-27 DIAGNOSIS — F4321 Adjustment disorder with depressed mood: Secondary | ICD-10-CM | POA: Diagnosis not present

## 2021-07-27 DIAGNOSIS — I10 Essential (primary) hypertension: Secondary | ICD-10-CM

## 2021-07-27 MED ORDER — LISINOPRIL-HYDROCHLOROTHIAZIDE 10-12.5 MG PO TABS: 1.0000 | ORAL_TABLET | Freq: Every day | ORAL | 3 refills | Status: DC

## 2021-07-27 MED ORDER — PAROXETINE HCL 10 MG PO TABS: 10.0000 mg | ORAL_TABLET | Freq: Every day | ORAL | 3 refills | Status: DC

## 2021-07-27 NOTE — Progress Notes (Signed)
Subjective:  Patient ID: Cheryl Myers, female    DOB: Aug 21, 1945  Age: 76 y.o. MRN: [id number]  Chief Complaint  Patient presents with   Hypertension   Anxiety    HPI  Hypertension: Patient is taking lisinopril 10 mg daily. She checks blood pressure at home when she feels that her blood pressure is high. She does not remember the measures.  Depression: Currently taking abilify 2 mg daily and paxil 20 mg daily.She is doing well and starting to get out and returning to a normal life.  Daughter is here and said she is much improved.  She did not get counseling.  Anxiety: Taking Xanax 0.5 mg twice a day AS NEEDED. Current Outpatient Medications on File Prior to Visit  Medication Sig Dispense Refill   ALPRAZolam (XANAX) 0.5 MG tablet Take 1 tablet (0.5 mg total) by mouth 2 (two) times daily as needed. for anxiety 60 tablet 3   ARIPiprazole (ABILIFY) 2 MG tablet Take 2 mg by mouth daily.     lisinopril (ZESTRIL) 10 MG tablet Take by mouth.     No current facility-administered medications on file prior to visit.   Past Medical History:  Diagnosis Date   Anxiety    Cancer ([place])    Basal and squamous cell skin cancer   Osteopenia 08/2014   T score -1.9 FRAX 11%/1.8%   Past Surgical History:  Procedure Laterality Date   Basal and squamous cell skin cancers excised      OVARIAN CYST REMOVAL      Family History  Problem Relation Age of Onset   Diabetes Mother    Alzheimer's disease Mother    Melanoma Brother    Diabetes Maternal Aunt    Melanoma Maternal Uncle    Diabetes Maternal Uncle    Heart disease Maternal Uncle    Melanoma Maternal Uncle    Heart disease Maternal Uncle    Social History   Socioeconomic History   Marital status: Married    Spouse name: Not on file   Number of children: Not on file   Years of education: Not on file   Highest education level: Not on file  Occupational History   Not on file  Tobacco Use   Smoking status: Never   Smokeless  tobacco: Never  Vaping Use   Vaping Use: Never used  Substance and Sexual Activity   Alcohol use: Yes    Alcohol/week: 1.0 standard drink    Types: 1 Glasses of wine per week    Comment: socially   Drug use: No   Sexual activity: Not Currently    Birth control/protection: Post-menopausal    Comment: 1st intercourse 41 yo--1 partner  Other Topics Concern   Not on file  Social History Narrative   Not on file   Social Determinants of Health   Financial Resource Strain: Low Risk    Difficulty of Paying Living Expenses: Not hard at all  Food Insecurity: Not on file  Transportation Needs: Not on file  Physical Activity: Not on file  Stress: Not on file  Social Connections: Not on file    Review of Systems  Constitutional:  Negative for chills, fatigue and fever.  HENT:  Negative for congestion, ear pain and sore throat.   Eyes:  Negative for visual disturbance.  Respiratory:  Negative for cough and shortness of breath.   Cardiovascular:  Negative for chest pain and palpitations.  Gastrointestinal:  Negative for abdominal pain, constipation, diarrhea, nausea and vomiting.  Endocrine: Negative for polydipsia, polyphagia and polyuria.  Genitourinary:  Negative for difficulty urinating and dysuria.  Musculoskeletal:  Negative for arthralgias, back pain and myalgias.  Skin:  Negative for rash.  Neurological:  Positive for headaches.  Psychiatric/Behavioral:  Negative for dysphoric mood. The patient is not nervous/anxious.     Objective:  BP (!) 148/90    Pulse 79    Temp 98.5 F (36.9 C)    Resp 15    Ht 5\' 7"  (1.702 m)    Wt 143 lb (64.9 kg)    SpO2 97%    BMI 22.40 kg/m   BP/Weight 07/27/2021 06/20/2021 81/82/9937  Systolic BP 169 678 -  Diastolic BP 90 80 -  Wt. (Lbs) 143 141 138  BMI 22.4 22.08 21.61    Physical Exam Vitals reviewed.  Constitutional:      General: She is not in acute distress.    Appearance: Normal appearance.  HENT:     Head: Normocephalic.      Right Ear: Tympanic membrane, ear canal and external ear normal.     Left Ear: Tympanic membrane, ear canal and external ear normal.     Nose: Nose normal.     Mouth/Throat:     Mouth: Mucous membranes are moist.     Pharynx: Oropharynx is clear.  Eyes:     Extraocular Movements: Extraocular movements intact.     Conjunctiva/sclera: Conjunctivae normal.     Pupils: Pupils are equal, round, and reactive to light.  Cardiovascular:     Rate and Rhythm: Normal rate and regular rhythm.     Pulses: Normal pulses.     Heart sounds: No murmur heard.   No gallop.  Pulmonary:     Effort: Pulmonary effort is normal. No respiratory distress.     Breath sounds: Normal breath sounds. No wheezing.  Abdominal:     General: Abdomen is flat. Bowel sounds are normal. There is no distension.     Palpations: Abdomen is soft.     Tenderness: There is no abdominal tenderness.  Musculoskeletal:        General: Normal range of motion.     Cervical back: Normal range of motion and neck supple.     Right lower leg: No edema.     Left lower leg: No edema.  Skin:    General: Skin is warm.     Capillary Refill: Capillary refill takes less than 2 seconds.  Neurological:     General: No focal deficit present.     Mental Status: She is alert and oriented to person, place, and time. Mental status is at baseline.     Gait: Gait normal.     Deep Tendon Reflexes: Reflexes normal.    Depression screen [facility] 2/9 07/27/2021 04/26/2021 03/21/2021 03/20/2020 12/15/2019  Decreased Interest 2 2 2 2 1   Down, Depressed, Hopeless 2 3 3 2 3   PHQ - 2 Score 4 5 5 4 4   Altered sleeping 0 3 3 1 3   Tired, decreased energy 0 1 0 0 0  Change in appetite 0 1 1 1  0  Feeling bad or failure about yourself  0 2 2 0 2  Trouble concentrating 0 0 0 0 0  Moving slowly or fidgety/restless 0 0 0 0 3  Suicidal thoughts 0 1 0 0 0  PHQ-9 Score 4 13 11 6 12   Difficult doing work/chores Somewhat difficult Somewhat difficult Very difficult Very  difficult Somewhat difficult  Lab Results  Component Value Date   WBC 3.7 03/21/2021   HGB 11.6 03/21/2021   HCT 35.4 03/21/2021   PLT 253 03/21/2021   GLUCOSE 79 03/21/2021   CHOL 222 (H) 10/04/2016   TRIG 82.0 10/04/2016   HDL 61.60 10/04/2016   LDLCALC 144 (H) 10/04/2016   ALT 13 03/21/2021   AST 16 03/21/2021   NA 141 03/21/2021   K 4.3 03/21/2021   CL 103 03/21/2021   CREATININE 0.84 03/21/2021   BUN 11 03/21/2021   CO2 24 03/21/2021   TSH 3.660 03/21/2021      Assessment & Plan:   Problem List Items Addressed This Visit       Cardiovascular and Mediastinum   HTN (hypertension), benign - Primary   Relevant Medications   lisinopril-hydrochlorothiazide (ZESTORETIC) 10-12.5 MG tablet BP remains up, increase BP medicine follow up BP one month     Other   Grief   Relevant Medications   PARoxetine (PAXIL) 10 MG tablet Greatly improved  .  Meds ordered this encounter  Medications   PARoxetine (PAXIL) 10 MG tablet    Sig: Take 1 tablet (10 mg total) by mouth daily.    Dispense:  30 tablet    Refill:  3   lisinopril-hydrochlorothiazide (ZESTORETIC) 10-12.5 MG tablet    Sig: Take 1 tablet by mouth daily.    Dispense:  90 tablet    Refill:  3       Follow-up: Return in about 1 month (around 08/27/2021) for bp.  An After Visit Summary was printed and given to the patient.  Reinaldo Meeker, MD [facility] [phone]

## 2021-07-29 ENCOUNTER — Encounter: Payer: Self-pay | Admitting: Legal Medicine

## 2021-08-24 DIAGNOSIS — H2513 Age-related nuclear cataract, bilateral: Secondary | ICD-10-CM | POA: Diagnosis not present

## 2021-08-27 ENCOUNTER — Other Ambulatory Visit: Payer: Self-pay

## 2021-08-27 ENCOUNTER — Ambulatory Visit (INDEPENDENT_AMBULATORY_CARE_PROVIDER_SITE_OTHER): Payer: Medicare Other | Admitting: Legal Medicine

## 2021-08-27 ENCOUNTER — Encounter: Payer: Self-pay | Admitting: Legal Medicine

## 2021-08-27 VITALS — BP 130/60 | HR 87 | Temp 98.3°F | Resp 15 | Ht 67.0 in | Wt 146.0 lb

## 2021-08-27 DIAGNOSIS — E782 Mixed hyperlipidemia: Secondary | ICD-10-CM

## 2021-08-27 DIAGNOSIS — I10 Essential (primary) hypertension: Secondary | ICD-10-CM | POA: Diagnosis not present

## 2021-08-27 DIAGNOSIS — R4589 Other symptoms and signs involving emotional state: Secondary | ICD-10-CM | POA: Diagnosis not present

## 2021-08-27 DIAGNOSIS — F324 Major depressive disorder, single episode, in partial remission: Secondary | ICD-10-CM

## 2021-08-27 NOTE — Progress Notes (Signed)
Subjective:  Patient ID: Cheryl Myers, female    DOB: May 14, 1946  Age: 76 y.o. MRN: [id number]  Chief Complaint  Patient presents with   Hypertension   Depression    HPI Patient is here for hypertension. She is taking lisinopril/hctz 10-12.5 mg daily. She denied any chest pain, sob, headache, or dizziness.  She is taking abilify 2 mg daily and she feel that it is helping for her anxiety and depression.  Patient presents with hyperlipidemia.  Compliance with treatment has been good; patient takes medicines as directed, maintains low cholesterol diet, follows up as directed, and maintains exercise regimen.  Patient is using none without problems.  Current Outpatient Medications on File Prior to Visit  Medication Sig Dispense Refill   ALPRAZolam (XANAX) 0.5 MG tablet Take 1 tablet (0.5 mg total) by mouth 2 (two) times daily as needed. for anxiety 60 tablet 3   ARIPiprazole (ABILIFY) 2 MG tablet Take 2 mg by mouth daily.     lisinopril-hydrochlorothiazide (ZESTORETIC) 10-12.5 MG tablet Take 1 tablet by mouth daily. 90 tablet 3   PARoxetine (PAXIL) 10 MG tablet Take 1 tablet (10 mg total) by mouth daily. 30 tablet 3   No current facility-administered medications on file prior to visit.   Past Medical History:  Diagnosis Date   Anxiety    Cancer ([place])    Basal and squamous cell skin cancer   Osteopenia 08/2014   T score -1.9 FRAX 11%/1.8%   Past Surgical History:  Procedure Laterality Date   Basal and squamous cell skin cancers excised      OVARIAN CYST REMOVAL      Family History  Problem Relation Age of Onset   Diabetes Mother    Alzheimer's disease Mother    Melanoma Brother    Diabetes Maternal Aunt    Melanoma Maternal Uncle    Diabetes Maternal Uncle    Heart disease Maternal Uncle    Melanoma Maternal Uncle    Heart disease Maternal Uncle    Social History   Socioeconomic History   Marital status: Married    Spouse name: Not on file   Number of children: Not  on file   Years of education: Not on file   Highest education level: Not on file  Occupational History   Not on file  Tobacco Use   Smoking status: Never   Smokeless tobacco: Never  Vaping Use   Vaping Use: Never used  Substance and Sexual Activity   Alcohol use: Yes    Alcohol/week: 1.0 standard drink    Types: 1 Glasses of wine per week    Comment: socially   Drug use: No   Sexual activity: Not Currently    Birth control/protection: Post-menopausal    Comment: 1st intercourse 5 yo--1 partner  Other Topics Concern   Not on file  Social History Narrative   Not on file   Social Determinants of Health   Financial Resource Strain: Low Risk    Difficulty of Paying Living Expenses: Not hard at all  Food Insecurity: Not on file  Transportation Needs: Not on file  Physical Activity: Not on file  Stress: Not on file  Social Connections: Not on file    Review of Systems  Constitutional:  Negative for chills, fatigue and fever.  HENT:  Negative for congestion, ear pain and sore throat.   Respiratory:  Negative for cough and shortness of breath.   Cardiovascular:  Negative for chest pain and palpitations.  Gastrointestinal:  Negative for abdominal pain, constipation, diarrhea, nausea and vomiting.  Endocrine: Negative for polydipsia, polyphagia and polyuria.  Genitourinary:  Negative for difficulty urinating and dysuria.  Musculoskeletal:  Negative for arthralgias, back pain and myalgias.  Skin:  Negative for rash.  Neurological:  Negative for headaches.  Psychiatric/Behavioral:  Negative for agitation, dysphoric mood, sleep disturbance and suicidal ideas. The patient is not nervous/anxious.     Objective:  BP 130/60    Pulse 87    Temp 98.3 F (36.8 C)    Resp 15    Ht 5\' 7"  (1.702 m)    Wt 146 lb (66.2 kg)    SpO2 98%    BMI 22.87 kg/m   BP/Weight 08/27/2021 07/27/2021 37/16/9678  Systolic BP 938 101 751  Diastolic BP 60 90 80  Wt. (Lbs) 146 143 141  BMI 22.87 22.4  22.08    Physical Exam Constitutional:      General: She is not in acute distress. HENT:     Head: Normocephalic.     Right Ear: Tympanic membrane normal.     Left Ear: Tympanic membrane normal.     Nose: Nose normal.     Mouth/Throat:     Pharynx: Oropharynx is clear.  Eyes:     Conjunctiva/sclera: Conjunctivae normal.  Cardiovascular:     Rate and Rhythm: Normal rate and regular rhythm.     Pulses: Normal pulses.     Heart sounds: Normal heart sounds. No murmur heard.   No gallop.  Pulmonary:     Effort: Pulmonary effort is normal. No respiratory distress.     Breath sounds: No wheezing.  Abdominal:     General: Abdomen is flat. There is no distension.     Tenderness: There is no abdominal tenderness.  Musculoskeletal:        General: Normal range of motion.     Right lower leg: No edema.     Left lower leg: No edema.  Skin:    General: Skin is warm.     Capillary Refill: Capillary refill takes less than 2 seconds.  Neurological:     General: No focal deficit present.     Mental Status: She is alert and oriented to person, place, and time.  Psychiatric:        Mood and Affect: Mood normal.        Thought Content: Thought content normal.        Judgment: Judgment normal.   Depression screen [facility] 2/9 08/27/2021 07/27/2021 04/26/2021 03/21/2021 03/20/2020  Decreased Interest 0 2 2 2 2   Down, Depressed, Hopeless 0 2 3 3 2   PHQ - 2 Score 0 4 5 5 4   Altered sleeping 2 0 3 3 1   Tired, decreased energy 0 0 1 0 0  Change in appetite 1 0 1 1 1   Feeling bad or failure about yourself  0 0 2 2 0  Trouble concentrating 0 0 0 0 0  Moving slowly or fidgety/restless 0 0 0 0 0  Suicidal thoughts 0 0 1 0 0  PHQ-9 Score 3 4 13 11 6   Difficult doing work/chores Not difficult at all Somewhat difficult Somewhat difficult Very difficult Very difficult         Lab Results  Component Value Date   WBC 3.7 03/21/2021   HGB 11.6 03/21/2021   HCT 35.4 03/21/2021   PLT 253 03/21/2021    GLUCOSE 79 03/21/2021   CHOL 222 (H) 10/04/2016   TRIG 82.0 10/04/2016  HDL 61.60 10/04/2016   LDLCALC 144 (H) 10/04/2016   ALT 13 03/21/2021   AST 16 03/21/2021   NA 141 03/21/2021   K 4.3 03/21/2021   CL 103 03/21/2021   CREATININE 0.84 03/21/2021   BUN 11 03/21/2021   CO2 24 03/21/2021   TSH 3.660 03/21/2021      Assessment & Plan:   Problem List Items Addressed This Visit       Cardiovascular and Mediastinum   HTN (hypertension), benign An individual hypertension care plan was established and reinforced today.  The patient's status was assessed using clinical findings on exam and labs or diagnostic tests. The patient's success at meeting treatment goals on disease specific evidence-based guidelines and found to be well controlled. SELF MANAGEMENT: The patient and I together assessed ways to personally work towards obtaining the recommended goals. RECOMMENDATIONS: avoid decongestants found in common cold remedies, decrease consumption of alcohol, perform routine monitoring of BP with home BP cuff, exercise, reduction of dietary salt, take medicines as prescribed, try not to miss doses and quit smoking.  Regular exercise and maintaining a healthy weight is needed.  Stress reduction may help. A CLINICAL SUMMARY including written plan identify barriers to care unique to individual due to social or financial issues.  We attempt to mutually creat solutions for individual and family understanding.      Other   Suicidal behavior without attempted self-injury Patient has not had any suicidal thoughts    Depression, major, single episode, in partial remission ([place]) - Primary Patient's depression is controlled with abilify.   Anhedonia better.  PHQ 9 was performed score 3. An individual care plan was established or reinforced today.  The patient's disease status was assessed using clinical findings on exam, labs, and or other diagnostic testing to determine patient's success in  meeting treatment goals based on disease specific evidence-based guidelines and found to be improving Recommendations include stay on medicines, consider stopping abilify next visit   .       Follow-up: Return in about 3 months (around 11/24/2021) for depression.  An After Visit Summary was printed and given to the patient.  Reinaldo Meeker, MD [facility] [phone]

## 2021-09-06 ENCOUNTER — Telehealth: Payer: Medicare Other

## 2021-10-02 ENCOUNTER — Other Ambulatory Visit: Payer: Self-pay | Admitting: Legal Medicine

## 2021-10-08 ENCOUNTER — Other Ambulatory Visit: Payer: Self-pay

## 2021-10-08 DIAGNOSIS — F4321 Adjustment disorder with depressed mood: Secondary | ICD-10-CM

## 2021-10-08 MED ORDER — ALPRAZOLAM 0.5 MG PO TABS: 0.5000 mg | ORAL_TABLET | Freq: Two times a day (BID) | ORAL | 3 refills | Status: DC | PRN

## 2021-10-08 NOTE — Telephone Encounter (Signed)
Patient called requesting Xanax refill be sent to [facility] on [address] ?

## 2021-10-09 ENCOUNTER — Other Ambulatory Visit: Payer: Self-pay

## 2021-10-09 DIAGNOSIS — F4321 Adjustment disorder with depressed mood: Secondary | ICD-10-CM

## 2021-11-25 NOTE — Progress Notes (Unsigned)
Subjective:  Patient ID: Cheryl Myers, female    DOB: [birth year]  Age: 76 y.o. MRN: [id number]  No chief complaint on file.   HPI   Depression: Patient is taking Paroxetine 10 mg daily, Abilify 2 mg daily at bedtime.  Anxiety: Taking Xanax 0.5 mg twice a day PRN.  Hypertension: She takes Lisinopril-Hctz 10/12.5 mg daily. Current Outpatient Medications on File Prior to Visit  Medication Sig Dispense Refill   ALPRAZolam (XANAX) 0.5 MG tablet Take 1 tablet (0.5 mg total) by mouth 2 (two) times daily as needed. for anxiety 60 tablet 3   ARIPiprazole (ABILIFY) 2 MG tablet TAKE 1 TABLET BY MOUTH AT BEDTIME AS NEEDED. 30 tablet 3   lisinopril-hydrochlorothiazide (ZESTORETIC) 10-12.5 MG tablet Take 1 tablet by mouth daily. 90 tablet 3   PARoxetine (PAXIL) 10 MG tablet Take 1 tablet (10 mg total) by mouth daily. 30 tablet 3   No current facility-administered medications on file prior to visit.   Past Medical History:  Diagnosis Date   Anxiety    Cancer ([place])    Basal and squamous cell skin cancer   Osteopenia 08/2014   T score -1.9 FRAX 11%/1.8%   Past Surgical History:  Procedure Laterality Date   Basal and squamous cell skin cancers excised      OVARIAN CYST REMOVAL      Family History  Problem Relation Age of Onset   Diabetes Mother    Alzheimer's disease Mother    Melanoma Brother    Diabetes Maternal Aunt    Melanoma Maternal Uncle    Diabetes Maternal Uncle    Heart disease Maternal Uncle    Melanoma Maternal Uncle    Heart disease Maternal Uncle    Social History   Socioeconomic History   Marital status: Married    Spouse name: Not on file   Number of children: Not on file   Years of education: Not on file   Highest education level: Not on file  Occupational History   Not on file  Tobacco Use   Smoking status: Never   Smokeless tobacco: Never  Vaping Use   Vaping Use: Never used  Substance and Sexual Activity   Alcohol use: Yes    Alcohol/week: 1.0  standard drink    Types: 1 Glasses of wine per week    Comment: socially   Drug use: No   Sexual activity: Not Currently    Birth control/protection: Post-menopausal    Comment: 1st intercourse 44 yo--1 partner  Other Topics Concern   Not on file  Social History Narrative   Not on file   Social Determinants of Health   Financial Resource Strain: Low Risk    Difficulty of Paying Living Expenses: Not hard at all  Food Insecurity: Not on file  Transportation Needs: Not on file  Physical Activity: Not on file  Stress: Not on file  Social Connections: Not on file    Review of Systems   Objective:  There were no vitals taken for this visit.     08/27/2021    1:39 PM 07/27/2021   10:24 AM 06/20/2021    3:20 PM  BP/Weight  Systolic BP 786 767 209  Diastolic BP 60 90 80  Wt. (Lbs) 146 143 141  BMI 22.87 kg/m2 22.4 kg/m2 22.08 kg/m2    Physical Exam  Diabetic Foot Exam - Simple   No data filed      Lab Results  Component Value Date   WBC 3.7  03/21/2021   HGB 11.6 03/21/2021   HCT 35.4 03/21/2021   PLT 253 03/21/2021   GLUCOSE 79 03/21/2021   CHOL 222 (H) 10/04/2016   TRIG 82.0 10/04/2016   HDL 61.60 10/04/2016   LDLCALC 144 (H) 10/04/2016   ALT 13 03/21/2021   AST 16 03/21/2021   NA 141 03/21/2021   K 4.3 03/21/2021   CL 103 03/21/2021   CREATININE 0.84 03/21/2021   BUN 11 03/21/2021   CO2 24 03/21/2021   TSH 3.660 03/21/2021      Assessment & Plan:   Problem List Items Addressed This Visit       Other   Depression, major, single episode, in partial remission ([place]) - Primary  .  No orders of the defined types were placed in this encounter.   No orders of the defined types were placed in this encounter.    Follow-up: No follow-ups on file.  An After Visit Summary was printed and given to the patient.  Reinaldo Meeker, MD [facility] [phone]

## 2021-11-27 ENCOUNTER — Ambulatory Visit (INDEPENDENT_AMBULATORY_CARE_PROVIDER_SITE_OTHER): Payer: Medicare Other | Admitting: Legal Medicine

## 2021-11-27 ENCOUNTER — Encounter: Payer: Self-pay | Admitting: Legal Medicine

## 2021-11-27 VITALS — BP 126/76 | HR 77 | Wt 147.6 lb

## 2021-11-27 DIAGNOSIS — I10 Essential (primary) hypertension: Secondary | ICD-10-CM

## 2021-11-27 DIAGNOSIS — R5383 Other fatigue: Secondary | ICD-10-CM

## 2021-11-27 DIAGNOSIS — F419 Anxiety disorder, unspecified: Secondary | ICD-10-CM

## 2021-11-27 DIAGNOSIS — F324 Major depressive disorder, single episode, in partial remission: Secondary | ICD-10-CM

## 2021-11-27 DIAGNOSIS — F4321 Adjustment disorder with depressed mood: Secondary | ICD-10-CM

## 2021-11-27 MED ORDER — PAROXETINE HCL 10 MG PO TABS: 10.0000 mg | ORAL_TABLET | Freq: Every day | ORAL | 3 refills | Status: DC

## 2022-01-18 ENCOUNTER — Telehealth: Payer: Self-pay

## 2022-01-18 NOTE — Telephone Encounter (Signed)
Patient's daughter called as patient is concerned her BP has been low and she is dizzy. Daughter then goes to explain patient believes she fell and loss consciousness yesterday. Confirmed patient did hit head. Advised patient go to hospital immediately. Daughter VU.   Royce Macadamia, [place] 01/18/22 10:15 AM

## 2022-01-20 ENCOUNTER — Other Ambulatory Visit: Payer: Self-pay | Admitting: Legal Medicine

## 2022-01-20 DIAGNOSIS — F4321 Adjustment disorder with depressed mood: Secondary | ICD-10-CM

## 2022-01-22 ENCOUNTER — Other Ambulatory Visit: Payer: Self-pay | Admitting: Legal Medicine

## 2022-01-30 ENCOUNTER — Ambulatory Visit (INDEPENDENT_AMBULATORY_CARE_PROVIDER_SITE_OTHER): Payer: Medicare Other | Admitting: Legal Medicine

## 2022-01-30 ENCOUNTER — Ambulatory Visit: Payer: Medicare Other | Admitting: Legal Medicine

## 2022-01-30 ENCOUNTER — Encounter: Payer: Self-pay | Admitting: Legal Medicine

## 2022-01-30 VITALS — BP 120/70 | HR 68 | Temp 98.8°F | Resp 15 | Ht 67.0 in | Wt 147.0 lb

## 2022-01-30 DIAGNOSIS — R55 Syncope and collapse: Secondary | ICD-10-CM | POA: Insufficient documentation

## 2022-01-30 DIAGNOSIS — Z6823 Body mass index (BMI) 23.0-23.9, adult: Secondary | ICD-10-CM | POA: Diagnosis not present

## 2022-01-30 DIAGNOSIS — F324 Major depressive disorder, single episode, in partial remission: Secondary | ICD-10-CM | POA: Diagnosis not present

## 2022-01-30 NOTE — Progress Notes (Signed)
Subjective:  Patient ID: Cheryl Myers, female    DOB: 1946-03-12  Age: 76 y.o. MRN: [id number]  Chief Complaint  Patient presents with   Loss of Consciousness   Hypertension    HPI acute  Patient felt dizzy 2 weeks ago and fell down and hurt her tailbone and hit her leg. She is taking BP multiple times a day. She hurt and bruise her leg.  She is happy.  She is eating more.  Current Outpatient Medications on File Prior to Visit  Medication Sig Dispense Refill   ALPRAZolam (XANAX) 0.5 MG tablet TAKE 1 TABLET BY MOUTH TWICE DAILY AS NEEDED FOR ANXIETY 60 tablet 3   ARIPiprazole (ABILIFY) 2 MG tablet TAKE 1 TABLET BY MOUTH AT BEDTIME AS NEEDED. 30 tablet 3   lisinopril (ZESTRIL) 10 MG tablet      PARoxetine (PAXIL) 10 MG tablet Take 1 tablet (10 mg total) by mouth daily. 30 tablet 3   No current facility-administered medications on file prior to visit.   Past Medical History:  Diagnosis Date   Anxiety    Cancer ([place])    Basal and squamous cell skin cancer   Osteopenia 08/2014   T score -1.9 FRAX 11%/1.8%   Past Surgical History:  Procedure Laterality Date   Basal and squamous cell skin cancers excised      OVARIAN CYST REMOVAL      Family History  Problem Relation Age of Onset   Diabetes Mother    Alzheimer's disease Mother    Melanoma Brother    Diabetes Maternal Aunt    Melanoma Maternal Uncle    Diabetes Maternal Uncle    Heart disease Maternal Uncle    Melanoma Maternal Uncle    Heart disease Maternal Uncle    Social History   Socioeconomic History   Marital status: Married    Spouse name: Not on file   Number of children: Not on file   Years of education: Not on file   Highest education level: Not on file  Occupational History   Not on file  Tobacco Use   Smoking status: Never   Smokeless tobacco: Never  Vaping Use   Vaping Use: Never used  Substance and Sexual Activity   Alcohol use: Yes    Alcohol/week: 1.0 standard drink of alcohol    Types:  1 Glasses of wine per week    Comment: socially   Drug use: No   Sexual activity: Not Currently    Birth control/protection: Post-menopausal    Comment: 1st intercourse 44 yo--1 partner  Other Topics Concern   Not on file  Social History Narrative   Not on file   Social Determinants of Health   Financial Resource Strain: Low Risk  (04/13/2021)   Overall Financial Resource Strain (CARDIA)    Difficulty of Paying Living Expenses: Not hard at all  Food Insecurity: Not on file  Transportation Needs: Not on file  Physical Activity: Not on file  Stress: Not on file  Social Connections: Not on file    Review of Systems  Constitutional:  Negative for chills, fatigue and fever.  HENT:  Negative for congestion, ear pain and sore throat.   Respiratory:  Negative for cough and shortness of breath.   Cardiovascular:  Negative for chest pain and palpitations.  Gastrointestinal:  Negative for abdominal pain, constipation, diarrhea, nausea and vomiting.  Endocrine: Negative for polydipsia, polyphagia and polyuria.  Genitourinary:  Negative for difficulty urinating and dysuria.  Musculoskeletal:  Negative for arthralgias, back pain and myalgias.  Skin:  Negative for rash.  Neurological:  Negative for headaches.  Psychiatric/Behavioral:  Negative for dysphoric mood. The patient is not nervous/anxious.      Objective:  BP 120/70   Pulse 68   Temp 98.8 F (37.1 C)   Resp 15   Ht '5\' 7"'$  (1.702 m)   Wt 147 lb (66.7 kg)   SpO2 99%   BMI 23.02 kg/m      01/30/2022    2:32 PM 11/27/2021    2:19 PM 08/27/2021    1:39 PM  BP/Weight  Systolic BP 742 595 638  Diastolic BP 70 76 60  Wt. (Lbs) 147 147.6 146  BMI 23.02 kg/m2 23.12 kg/m2 22.87 kg/m2    Physical Exam      Lab Results  Component Value Date   WBC 5.2 01/30/2022   HGB 11.2 01/30/2022   HCT 33.9 (L) 01/30/2022   PLT 306 01/30/2022   GLUCOSE 94 01/30/2022   CHOL 222 (H) 10/04/2016   TRIG 82.0 10/04/2016   HDL 61.60  10/04/2016   LDLCALC 144 (H) 10/04/2016   ALT 13 01/30/2022   AST 16 01/30/2022   NA 135 01/30/2022   K 5.1 01/30/2022   CL 98 01/30/2022   CREATININE 0.98 01/30/2022   BUN 24 01/30/2022   CO2 22 01/30/2022   TSH 3.660 03/21/2021      Assessment & Plan:   Problem List Items Addressed This Visit       Other   Depression, major, single episode, in partial remission ([place]) - Primary Depression improving, I suggested stopping aripiprazole that can have anticolangergic effects    BMI 23.0-23.9, adult Stable weight, more active    Syncope   Relevant Orders   CBC with Differential/Platelet (Completed)   Comprehensive metabolic panel (Completed) Appears to be vasovagal, positive orthostatics with 63m drop, encourage fluids, stop aripiprazole, if her depression worsens, we will restrat.  If continued problems follow up  .       Follow-up: Return next scheduled visit.  An After Visit Summary was printed and given to the patient.  LReinaldo Meeker MD [facility] [phone]

## 2022-01-31 LAB — COMPREHENSIVE METABOLIC PANEL
ALT: 13 IU/L (ref 0–32)
AST: 16 IU/L (ref 0–40)
Albumin/Globulin Ratio: 1.8 (ref 1.2–2.2)
Albumin: 4.4 g/dL (ref 3.8–4.8)
Alkaline Phosphatase: 87 IU/L (ref 44–121)
BUN/Creatinine Ratio: 24 (ref 12–28)
BUN: 24 mg/dL (ref 8–27)
Bilirubin Total: 0.4 mg/dL (ref 0.0–1.2)
CO2: 22 mmol/L (ref 20–29)
Calcium: 10.2 mg/dL (ref 8.7–10.3)
Chloride: 98 mmol/L (ref 96–106)
Creatinine, Ser: 0.98 mg/dL (ref 0.57–1.00)
Globulin, Total: 2.5 g/dL (ref 1.5–4.5)
Glucose: 94 mg/dL (ref 70–99)
Potassium: 5.1 mmol/L (ref 3.5–5.2)
Sodium: 135 mmol/L (ref 134–144)
Total Protein: 6.9 g/dL (ref 6.0–8.5)
eGFR: 60 mL/min/{1.73_m2} (ref >59)

## 2022-01-31 LAB — CBC WITH DIFFERENTIAL/PLATELET
Basophils Absolute: 0 10*3/uL (ref 0.0–0.2)
Basos: 1 %
EOS (ABSOLUTE): 0.1 10*3/uL (ref 0.0–0.4)
Eos: 3 %
Hematocrit: 33.9 % — ABNORMAL LOW (ref 34.0–46.6)
Hemoglobin: 11.2 g/dL (ref 11.1–15.9)
Immature Grans (Abs): 0 10*3/uL (ref 0.0–0.1)
Immature Granulocytes: 0 %
Lymphocytes Absolute: 1.8 10*3/uL (ref 0.7–3.1)
Lymphs: 35 %
MCH: 30.2 pg (ref 26.6–33.0)
MCHC: 33 g/dL (ref 31.5–35.7)
MCV: 91 fL (ref 79–97)
Monocytes Absolute: 0.5 10*3/uL (ref 0.1–0.9)
Monocytes: 9 %
Neutrophils Absolute: 2.7 10*3/uL (ref 1.4–7.0)
Neutrophils: 52 %
Platelets: 306 10*3/uL (ref 150–450)
RBC: 3.71 x10E6/uL — ABNORMAL LOW (ref 3.77–5.28)
RDW: 12.7 % (ref 11.7–15.4)
WBC: 5.2 10*3/uL (ref 3.4–10.8)

## 2022-01-31 NOTE — Progress Notes (Signed)
CBC good, kidney and liver tests normal, calcium and potassium normal lp

## 2022-02-08 ENCOUNTER — Telehealth: Payer: Self-pay

## 2022-02-08 NOTE — Progress Notes (Signed)
Care Gap(s) Not Met that Need to be Addressed:   Controlling High Blood Pressure   Action Taken: Updated care gap list with latest BP 01/30/22 120/70   Follow Up: 03/05/22 with Dr. Henrene Pastor

## 2022-03-05 ENCOUNTER — Ambulatory Visit (INDEPENDENT_AMBULATORY_CARE_PROVIDER_SITE_OTHER): Payer: Medicare Other | Admitting: Legal Medicine

## 2022-03-05 ENCOUNTER — Encounter: Payer: Self-pay | Admitting: Legal Medicine

## 2022-03-05 VITALS — BP 120/70 | HR 77 | Temp 95.8°F | Resp 14 | Ht 67.0 in | Wt 150.0 lb

## 2022-03-05 DIAGNOSIS — F324 Major depressive disorder, single episode, in partial remission: Secondary | ICD-10-CM | POA: Diagnosis not present

## 2022-03-05 DIAGNOSIS — R55 Syncope and collapse: Secondary | ICD-10-CM

## 2022-03-05 DIAGNOSIS — Z6823 Body mass index (BMI) 23.0-23.9, adult: Secondary | ICD-10-CM | POA: Diagnosis not present

## 2022-03-05 DIAGNOSIS — I10 Essential (primary) hypertension: Secondary | ICD-10-CM

## 2022-03-05 DIAGNOSIS — F4321 Adjustment disorder with depressed mood: Secondary | ICD-10-CM

## 2022-03-05 MED ORDER — ALPRAZOLAM 0.5 MG PO TABS: 0.5000 mg | ORAL_TABLET | Freq: Two times a day (BID) | ORAL | 3 refills | Status: DC | PRN

## 2022-03-05 MED ORDER — LISINOPRIL 10 MG PO TABS: 10.0000 mg | ORAL_TABLET | Freq: Every day | ORAL | 2 refills | Status: DC

## 2022-03-05 MED ORDER — PAROXETINE HCL 10 MG PO TABS: 10.0000 mg | ORAL_TABLET | Freq: Every day | ORAL | 3 refills | Status: DC

## 2022-03-05 MED ORDER — ARIPIPRAZOLE 2 MG PO TABS: 2.0000 mg | ORAL_TABLET | Freq: Every evening | ORAL | 3 refills | Status: DC | PRN

## 2022-03-05 NOTE — Progress Notes (Signed)
Subjective:  Patient ID: Cheryl Myers, female    DOB: 06-17-1946  Age: 76 y.o. MRN: [id number]  Chief Complaint  Patient presents with   Dizziness    HPI  Patient feels much better. She does not feel so much dizzy because she is drinking more water and doing more things around the house. She is still taking abiify she got more depressed off abilify.  Getting out well. No current outpatient medications on file prior to visit.   No current facility-administered medications on file prior to visit.   Past Medical History:  Diagnosis Date   Anxiety    Cancer ([place])    Basal and squamous cell skin cancer   Osteopenia 08/2014   T score -1.9 FRAX 11%/1.8%   Past Surgical History:  Procedure Laterality Date   Basal and squamous cell skin cancers excised      OVARIAN CYST REMOVAL      Family History  Problem Relation Age of Onset   Diabetes Mother    Alzheimer's disease Mother    Melanoma Brother    Diabetes Maternal Aunt    Melanoma Maternal Uncle    Diabetes Maternal Uncle    Heart disease Maternal Uncle    Melanoma Maternal Uncle    Heart disease Maternal Uncle    Social History   Socioeconomic History   Marital status: Married    Spouse name: Not on file   Number of children: Not on file   Years of education: Not on file   Highest education level: Not on file  Occupational History   Not on file  Tobacco Use   Smoking status: Never   Smokeless tobacco: Never  Vaping Use   Vaping Use: Never used  Substance and Sexual Activity   Alcohol use: Yes    Alcohol/week: 1.0 standard drink of alcohol    Types: 1 Glasses of wine per week    Comment: socially   Drug use: No   Sexual activity: Not Currently    Birth control/protection: Post-menopausal    Comment: 1st intercourse 8 yo--1 partner  Other Topics Concern   Not on file  Social History Narrative   Not on file   Social Determinants of Health   Financial Resource Strain: Low Risk  (04/13/2021)   Overall  Financial Resource Strain (CARDIA)    Difficulty of Paying Living Expenses: Not hard at all  Food Insecurity: Not on file  Transportation Needs: Not on file  Physical Activity: Not on file  Stress: Not on file  Social Connections: Not on file    Review of Systems  Constitutional:  Negative for chills, fatigue and fever.  HENT:  Negative for congestion, ear pain and sore throat.   Eyes:  Negative for visual disturbance.  Respiratory:  Negative for cough and shortness of breath.   Cardiovascular:  Negative for chest pain and palpitations.  Gastrointestinal:  Negative for abdominal pain, constipation, diarrhea, nausea and vomiting.  Endocrine: Negative for polydipsia, polyphagia and polyuria.  Genitourinary:  Negative for difficulty urinating and dysuria.  Musculoskeletal:  Negative for arthralgias, back pain and myalgias.  Skin:  Negative for rash.  Neurological:  Positive for dizziness (Sometimes in the morning). Negative for headaches.  Psychiatric/Behavioral:  Negative for dysphoric mood. The patient is not nervous/anxious.      Objective:  BP 120/70   Pulse 77   Temp (!) 95.8 F (35.4 C)   Resp 14   Ht '5\' 7"'$  (1.702 m)   Wt 150  lb (68 kg)   SpO2 100%   BMI 23.49 kg/m      03/05/2022    2:03 PM 01/30/2022    2:32 PM 11/27/2021    2:19 PM  BP/Weight  Systolic BP 427 062 376  Diastolic BP 70 70 76  Wt. (Lbs) 150 147 147.6  BMI 23.49 kg/m2 23.02 kg/m2 23.12 kg/m2    Physical Exam Vitals reviewed.  Constitutional:      Appearance: Normal appearance.  HENT:     Head: Normocephalic and atraumatic.     Right Ear: Tympanic membrane normal.     Left Ear: Tympanic membrane normal.     Nose: Nose normal.     Mouth/Throat:     Mouth: Mucous membranes are moist.     Pharynx: Oropharynx is clear.  Eyes:     Extraocular Movements: Extraocular movements intact.     Conjunctiva/sclera: Conjunctivae normal.     Pupils: Pupils are equal, round, and reactive to light.   Cardiovascular:     Rate and Rhythm: Normal rate and regular rhythm.     Pulses: Normal pulses.     Heart sounds: Normal heart sounds. No murmur heard.    No gallop.  Pulmonary:     Effort: Pulmonary effort is normal. No respiratory distress.     Breath sounds: Normal breath sounds. No wheezing.  Abdominal:     General: Abdomen is flat. Bowel sounds are normal. There is no distension.     Palpations: Abdomen is soft.     Tenderness: There is no abdominal tenderness.  Musculoskeletal:        General: Normal range of motion.     Cervical back: Normal range of motion and neck supple.     Right lower leg: No edema.     Left lower leg: No edema.  Skin:    General: Skin is warm and dry.     Capillary Refill: Capillary refill takes less than 2 seconds.  Neurological:     General: No focal deficit present.     Mental Status: She is alert and oriented to person, place, and time. Mental status is at baseline.     Gait: Gait normal.  Psychiatric:        Mood and Affect: Mood normal.        Thought Content: Thought content normal.         Lab Results  Component Value Date   WBC 5.2 01/30/2022   HGB 11.2 01/30/2022   HCT 33.9 (L) 01/30/2022   PLT 306 01/30/2022   GLUCOSE 94 01/30/2022   CHOL 222 (H) 10/04/2016   TRIG 82.0 10/04/2016   HDL 61.60 10/04/2016   LDLCALC 144 (H) 10/04/2016   ALT 13 01/30/2022   AST 16 01/30/2022   NA 135 01/30/2022   K 5.1 01/30/2022   CL 98 01/30/2022   CREATININE 0.98 01/30/2022   BUN 24 01/30/2022   CO2 22 01/30/2022   TSH 3.660 03/21/2021      Assessment & Plan:   Problem List Items Addressed This Visit       Cardiovascular and Mediastinum   HTN (hypertension), benign   Relevant Medications   lisinopril (ZESTRIL) 10 MG tablet An individual hypertension care plan was established and reinforced today.  The patient's status was assessed using clinical findings on exam and labs or diagnostic tests. The patient's success at meeting  treatment goals on disease specific evidence-based guidelines and found to be well controlled. SELF MANAGEMENT: The patient and  I together assessed ways to personally work towards obtaining the recommended goals. RECOMMENDATIONS: avoid decongestants found in common cold remedies, decrease consumption of alcohol, perform routine monitoring of BP with home BP cuff, exercise, reduction of dietary salt, take medicines as prescribed, try not to miss doses and quit smoking.  Regular exercise and maintaining a healthy weight is needed.  Stress reduction may help. A CLINICAL SUMMARY including written plan identify barriers to care unique to individual due to social or financial issues.  We attempt to mutually creat solutions for individual and family understanding.      Other   Grief   Relevant Medications   PARoxetine (PAXIL) 10 MG tablet   ALPRAZolam (XANAX) 0.5 MG tablet Resolving well    Depression, major, single episode, in partial remission (HCC)   Relevant Medications   PARoxetine (PAXIL) 10 MG tablet   ARIPiprazole (ABILIFY) 2 MG tablet   ALPRAZolam (XANAX) 0.5 MG tablet Patient is stable on medicines and wants to continue, She is getting out and back to her unusual self    BMI 23.0-23.9, adult Continue diet and exercise    Syncope - Primary No further dizziness or syncope  .       Follow-up: Return in about 6 months (around 09/03/2022).  An After Visit Summary was printed and given to the patient.  Reinaldo Meeker, MD [facility] [phone]

## 2022-07-10 DIAGNOSIS — H04123 Dry eye syndrome of bilateral lacrimal glands: Secondary | ICD-10-CM | POA: Diagnosis not present

## 2022-07-10 DIAGNOSIS — H5789 Other specified disorders of eye and adnexa: Secondary | ICD-10-CM | POA: Diagnosis not present

## 2022-07-19 DIAGNOSIS — H02831 Dermatochalasis of right upper eyelid: Secondary | ICD-10-CM | POA: Diagnosis not present

## 2022-07-19 DIAGNOSIS — D485 Neoplasm of uncertain behavior of skin: Secondary | ICD-10-CM | POA: Diagnosis not present

## 2022-07-19 DIAGNOSIS — C44329 Squamous cell carcinoma of skin of other parts of face: Secondary | ICD-10-CM | POA: Diagnosis not present

## 2022-07-19 DIAGNOSIS — H02834 Dermatochalasis of left upper eyelid: Secondary | ICD-10-CM | POA: Diagnosis not present

## 2022-07-19 DIAGNOSIS — C441222 Squamous cell carcinoma of skin of right lower eyelid, including canthus: Secondary | ICD-10-CM | POA: Diagnosis not present

## 2022-08-15 NOTE — Progress Notes (Signed)
Subjective:  Patient ID: Cheryl Myers, female    DOB: Mar 22, 1946  Age: 77 y.o. MRN: [id number]  Chief Complaint  Patient presents with   Hypertension   Hyperlipidemia     History of Present illness:   Patient is here for a regular follow up for her Hypertension, Hyperlipidemia, Depression. Patient is hypertensive on today's visit and she said she is usually high at home also. Knowing the readings makes her more anxious and she choose not to take blood pressure at home. She is taking lisinopril 10 mg OD. Since her husband passed way 3 years ago she is more depressive, she is taking abilify 2 mg at bed time, alprazolam 0.5 mg two times daily AS NEEDED, Paxil 10 mg daily. But she thinks dose is not adequate for her, she is having crying spells often missing her husband and how her life would be different if he was here. She lives alone but her son, daughter and daughter in law lives closeby and helping her to her doctors appointment and groceries. She is watching her diet and walks daily and cleans house for her exercise. 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   HYPERTENSION / HYPERLIPIDEMIA  Satisfied with current treatment? no Duration of hypertension: chronic BP monitoring frequency: daily BP range: 150/80 to 160/100 BP medication side effects: no Past BP meds: Lisinopril 10 mg daily. Duration of hyperlipidemia:  n/a Cholesterol medication side effects: no Cholesterol supplements: none Past cholesterol medications: none Medication compliance: good compliance Aspirin: no Recent stressors: yes  Depression/Anxiety: She reports good compliance with treatment. She is not having side effects.   She reports good tolerance of treatment. She is taking Abilify 2 mg at bed time, alprazolam 0.5 mg two times  daily AS NEEDED, Paxil 10 mg daily Current symptoms include:  anxiety She feels she is Unchanged since last visit.     08/16/2022   12:10 PM 03/05/2022    2:07 PM 11/27/2021    2:20 PM  Depression screen PHQ 2/9  Decreased Interest 1 1 1  $ Down, Depressed, Hopeless 1 0 1  PHQ - 2 Score 2 1 2  $ Altered sleeping 1 0 3  Tired, decreased energy 1 0 1  Change in appetite 0 0 2  Feeling bad or failure about yourself  0 0 3  Trouble concentrating  0 2  Moving slowly or fidgety/restless 0 0 1  Suicidal thoughts 0 0 0  PHQ-9 Score 4 1 14  $ Difficult doing work/chores Somewhat difficult Somewhat difficult Not difficult at all       08/16/2022   12:11 PM 11/27/2021    2:20 PM 09/20/2019   11:54 AM  GAD 7 : Generalized Anxiety Score  Nervous, Anxious, on Edge 1 3 3  $ Control/stop worrying 1 3 3  $ Worry too much - different things 1 3 3  $ Trouble relaxing 1 1 3  $ Restless 1 3 3  $ Easily annoyed or irritable 1 0 2  Afraid - awful might happen 1 3 3  $ Total GAD 7 Score 7 16 20  $ Anxiety Difficulty Somewhat difficult Not difficult at all Very difficult     Current Outpatient Medications on File Prior to Visit  Medication Sig Dispense Refill   ALPRAZolam (XANAX) 0.5 MG tablet Take 1 tablet (0.5 mg total) by mouth 2 (  two) times daily as needed. for anxiety 60 tablet 3   No current facility-administered medications on file prior to visit.   Past Medical History:  Diagnosis Date   Anxiety    Cancer ([place])    Basal and squamous cell skin cancer   Osteopenia 08/2014   T score -1.9 FRAX 11%/1.8%   Past Surgical History:  Procedure Laterality Date   Basal and squamous cell skin cancers excised      OVARIAN CYST REMOVAL      Family History  Problem Relation Age of Onset   Diabetes Mother    Alzheimer's disease Mother    Melanoma Brother    Diabetes Maternal Aunt    Melanoma Maternal Uncle    Diabetes Maternal Uncle    Heart disease Maternal Uncle    Melanoma Maternal Uncle    Heart disease  Maternal Uncle    Social History   Socioeconomic History   Marital status: Married    Spouse name: Not on file   Number of children: Not on file   Years of education: Not on file   Highest education level: Not on file  Occupational History   Not on file  Tobacco Use   Smoking status: Never   Smokeless tobacco: Never  Vaping Use   Vaping Use: Never used  Substance and Sexual Activity   Alcohol use: Yes    Alcohol/week: 1.0 standard drink of alcohol    Types: 1 Glasses of wine per week    Comment: socially   Drug use: No   Sexual activity: Not Currently    Birth control/protection: Post-menopausal    Comment: 1st intercourse 3 yo--1 partner  Other Topics Concern   Not on file  Social History Narrative   Not on file   Social Determinants of Health   Financial Resource Strain: Low Risk  (04/13/2021)   Overall Financial Resource Strain (CARDIA)    Difficulty of Paying Living Expenses: Not hard at all  Food Insecurity: Not on file  Transportation Needs: Not on file  Physical Activity: Not on file  Stress: Not on file  Social Connections: Not on file    Review of Systems  Constitutional:  Negative for chills, diaphoresis, fatigue and fever.  HENT:  Negative for congestion, ear pain and sore throat.   Respiratory:  Negative for cough and shortness of breath.   Cardiovascular:  Negative for chest pain and palpitations.  Gastrointestinal:  Negative for abdominal pain, constipation, diarrhea, nausea and vomiting.  Endocrine: Negative for polydipsia, polyphagia and polyuria.  Genitourinary:  Negative for dysuria and frequency.  Musculoskeletal:  Negative for arthralgias, back pain and myalgias.  Neurological:  Negative for dizziness and headaches.  Psychiatric/Behavioral:  Positive for sleep disturbance. Negative for dysphoric mood. The patient is nervous/anxious.      Objective:  BP (!) 150/80   Pulse 71   Temp 98.9 F (37.2 C)   Resp 14   Ht 5' 7"$  (1.702 m)   Wt  149 lb (67.6 kg)   SpO2 97%   BMI 23.34 kg/m      08/16/2022   11:23 AM 03/05/2022    2:03 PM 01/30/2022    2:32 PM  BP/Weight  Systolic BP [id number]  Diastolic BP 80 70 70  Wt. (Lbs) 149 150 147  BMI 23.34 kg/m2 23.49 kg/m2 23.02 kg/m2    Physical Exam Vitals reviewed.  Constitutional:      Appearance: Normal appearance.  Neck:     Vascular: No  carotid bruit.  Cardiovascular:     Rate and Rhythm: Normal rate and regular rhythm.     Heart sounds: Normal heart sounds.  Pulmonary:     Effort: Pulmonary effort is normal.     Breath sounds: Normal breath sounds.  Abdominal:     General: Bowel sounds are normal.     Palpations: Abdomen is soft.     Tenderness: There is no abdominal tenderness.  Neurological:     Mental Status: She is alert and oriented to person, place, and time.  Psychiatric:        Mood and Affect: Mood normal.        Behavior: Behavior normal.       09/26/2016    2:48 PM 09/20/2019   10:22 AM 12/15/2019   11:34 AM 03/21/2021    1:31 PM 08/16/2022   12:13 PM  Fall Risk  Falls in the past year? No 1 0 0 0  Was there an injury with Fall?  0 0 0 0  Fall Risk Category Calculator  1 0 0 0  Fall Risk Category (Retired)  Low Low Low   (RETIRED) Patient Fall Risk Level   Low fall risk Low fall risk   Patient at Risk for Falls Due to  Other (Comment)  No Fall Risks No Fall Risks;Other (Comment)  Fall risk Follow up   Falls evaluation completed Education provided Follow up appointment    Lab Results  Component Value Date   WBC 5.2 01/30/2022   HGB 11.2 01/30/2022   HCT 33.9 (L) 01/30/2022   PLT 306 01/30/2022   GLUCOSE 94 01/30/2022   CHOL 222 (H) 10/04/2016   TRIG 82.0 10/04/2016   HDL 61.60 10/04/2016   LDLCALC 144 (H) 10/04/2016   ALT 13 01/30/2022   AST 16 01/30/2022   NA 135 01/30/2022   K 5.1 01/30/2022   CL 98 01/30/2022   CREATININE 0.98 01/30/2022   BUN 24 01/30/2022   CO2 22 01/30/2022   TSH 3.660 03/21/2021    Assessment & Plan:    HTN (hypertension), benign Assessment & Plan: Discontinue lisinopril 10 mg OD Started Diovan-HCT 160- 25 daily  Keep a log book for 2 weeks Follow up in 2 weeks  Nutrition: Stressed importance of moderation in sodium intake, saturated fat and cholesterol, caloric balance, sufficient intake of complex carbohydrates, fiber, calcium and iron.   Exercise: Stressed the importance of regular exercise.     Orders: -     Comprehensive metabolic panel -     CBC with Differential/Platelet -     Valsartan-hydroCHLOROthiazide; Take 1 tablet by mouth daily.  Dispense: 90 tablet; Refill: 1 -     T4, free -     TSH  Mixed hyperlipidemia Assessment & Plan: Will check lipid profile, her lipid from last time was abnormal  Nutrition: Stressed importance of moderation in sodium intake, saturated fat and cholesterol, caloric balance, sufficient intake of complex carbohydrates, fiber, calcium and iron.   Exercise: Stressed the importance of regular exercise.     Orders: -     Lipid panel  Depression, major, single episode, in partial remission (HCC) Assessment & Plan: PHQ 4 GAD 7  Continue Xanax 0.5 mg BD AS NEEDED  Increased dose of Abilify from 2.5 mg to 5 mg Increased the dose of Paxil to 20 mg from 10 mg   Orders: -     ARIPiprazole; Take 1 tablet (5 mg total) by mouth daily.  Dispense: 90 tablet; Refill:  0 -     PARoxetine HCl; Take 1 tablet (20 mg total) by mouth daily.  Dispense: 90 tablet; Refill: 0  GAD (generalized anxiety disorder) Assessment & Plan: Continue Xanax 0.5 mg BD AS NEEDED  Increased dose of Abilify from 2.5 mg to 5 mg Increased the dose of Paxil to 20 mg from 10 mg    Follow-up: Return in about 2 weeks (around 08/30/2022).  An After Visit Summary was printed and given to the patient.  I, Neil Crouch have reviewed all documentation for this visit. The documentation on 08/16/22   for the exam, diagnosis, procedures, and orders are all accurate and complete.     Neil Crouch, DNP, [place] [facility] [phone]

## 2022-08-16 ENCOUNTER — Encounter: Payer: Self-pay | Admitting: Nurse Practitioner

## 2022-08-16 ENCOUNTER — Ambulatory Visit (INDEPENDENT_AMBULATORY_CARE_PROVIDER_SITE_OTHER): Payer: Medicare Other | Admitting: Nurse Practitioner

## 2022-08-16 VITALS — BP 150/80 | HR 71 | Temp 98.9°F | Resp 14 | Ht 67.0 in | Wt 149.0 lb

## 2022-08-16 DIAGNOSIS — F411 Generalized anxiety disorder: Secondary | ICD-10-CM

## 2022-08-16 DIAGNOSIS — I10 Essential (primary) hypertension: Secondary | ICD-10-CM

## 2022-08-16 DIAGNOSIS — E782 Mixed hyperlipidemia: Secondary | ICD-10-CM

## 2022-08-16 DIAGNOSIS — F324 Major depressive disorder, single episode, in partial remission: Secondary | ICD-10-CM

## 2022-08-16 MED ORDER — VALSARTAN-HYDROCHLOROTHIAZIDE 160-25 MG PO TABS: 1.0000 | ORAL_TABLET | Freq: Every day | ORAL | 1 refills | Status: DC

## 2022-08-16 MED ORDER — PAROXETINE HCL 20 MG PO TABS: 20.0000 mg | ORAL_TABLET | Freq: Every day | ORAL | 0 refills | Status: DC

## 2022-08-16 MED ORDER — ARIPIPRAZOLE 5 MG PO TABS: 5.0000 mg | ORAL_TABLET | Freq: Every day | ORAL | 0 refills | Status: DC

## 2022-08-16 NOTE — Patient Instructions (Signed)

## 2022-08-16 NOTE — Assessment & Plan Note (Signed)
Continue Xanax 0.5 mg BD AS NEEDED  Increased dose of Abilify from 2.5 mg to 5 mg Increased the dose of Paxil to 20 mg from 10 mg

## 2022-08-16 NOTE — Assessment & Plan Note (Signed)
Discontinue lisinopril 10 mg OD Started Diovan-HCT 160- 25 daily  Keep a log book for 2 weeks Follow up in 2 weeks  Nutrition: Stressed importance of moderation in sodium intake, saturated fat and cholesterol, caloric balance, sufficient intake of complex carbohydrates, fiber, calcium and iron.   Exercise: Stressed the importance of regular exercise.

## 2022-08-16 NOTE — Assessment & Plan Note (Signed)
Will check lipid profile, her lipid from last time was abnormal  Nutrition: Stressed importance of moderation in sodium intake, saturated fat and cholesterol, caloric balance, sufficient intake of complex carbohydrates, fiber, calcium and iron.   Exercise: Stressed the importance of regular exercise.

## 2022-08-16 NOTE — Assessment & Plan Note (Addendum)
Continue Xanax 0.5 mg BD AS NEEDED  Increased dose of Abilify from 2.5 mg to 5 mg Increased the dose of Paxil to 20 mg from 10 mg

## 2022-08-16 NOTE — Assessment & Plan Note (Signed)
PHQ 4 GAD 7  Continue Xanax 0.5 mg BD AS NEEDED  Increased dose of Abilify from 2.5 mg to 5 mg Increased the dose of Paxil to 20 mg from 10 mg

## 2022-08-17 LAB — CBC WITH DIFFERENTIAL/PLATELET
Basophils Absolute: 0 10*3/uL (ref 0.0–0.2)
Basos: 1 %
EOS (ABSOLUTE): 0.1 10*3/uL (ref 0.0–0.4)
Eos: 3 %
Hematocrit: 36.7 % (ref 34.0–46.6)
Hemoglobin: 12.1 g/dL (ref 11.1–15.9)
Immature Grans (Abs): 0 10*3/uL (ref 0.0–0.1)
Immature Granulocytes: 0 %
Lymphocytes Absolute: 1.5 10*3/uL (ref 0.7–3.1)
Lymphs: 38 %
MCH: 29.3 pg (ref 26.6–33.0)
MCHC: 33 g/dL (ref 31.5–35.7)
MCV: 89 fL (ref 79–97)
Monocytes Absolute: 0.4 10*3/uL (ref 0.1–0.9)
Monocytes: 9 %
Neutrophils Absolute: 1.9 10*3/uL (ref 1.4–7.0)
Neutrophils: 49 %
Platelets: 245 10*3/uL (ref 150–450)
RBC: 4.13 x10E6/uL (ref 3.77–5.28)
RDW: 12.8 % (ref 11.7–15.4)
WBC: 3.9 10*3/uL (ref 3.4–10.8)

## 2022-08-17 LAB — CARDIOVASCULAR RISK ASSESSMENT

## 2022-08-17 LAB — COMPREHENSIVE METABOLIC PANEL
ALT: 13 IU/L (ref 0–32)
AST: 18 IU/L (ref 0–40)
Albumin/Globulin Ratio: 2 (ref 1.2–2.2)
Albumin: 4.4 g/dL (ref 3.8–4.8)
Alkaline Phosphatase: 91 IU/L (ref 44–121)
BUN/Creatinine Ratio: 15 (ref 12–28)
BUN: 14 mg/dL (ref 8–27)
Bilirubin Total: 0.5 mg/dL (ref 0.0–1.2)
CO2: 21 mmol/L (ref 20–29)
Calcium: 9.9 mg/dL (ref 8.7–10.3)
Chloride: 103 mmol/L (ref 96–106)
Creatinine, Ser: 0.95 mg/dL (ref 0.57–1.00)
Globulin, Total: 2.2 g/dL (ref 1.5–4.5)
Glucose: 94 mg/dL (ref 70–99)
Potassium: 5.4 mmol/L — ABNORMAL HIGH (ref 3.5–5.2)
Sodium: 143 mmol/L (ref 134–144)
Total Protein: 6.6 g/dL (ref 6.0–8.5)
eGFR: 62 mL/min/{1.73_m2} (ref >59)

## 2022-08-17 LAB — T4, FREE: Free T4: 0.94 ng/dL (ref 0.82–1.77)

## 2022-08-17 LAB — LIPID PANEL
Chol/HDL Ratio: 4.1 ratio (ref 0.0–4.4)
Cholesterol, Total: 262 mg/dL — ABNORMAL HIGH (ref 100–199)
HDL: 64 mg/dL (ref >39)
LDL Chol Calc (NIH): 175 mg/dL — ABNORMAL HIGH (ref 0–99)
Triglycerides: 127 mg/dL (ref 0–149)
VLDL Cholesterol Cal: 23 mg/dL (ref 5–40)

## 2022-08-17 LAB — TSH: TSH: 2.99 u[IU]/mL (ref 0.450–4.500)

## 2022-09-02 ENCOUNTER — Ambulatory Visit (INDEPENDENT_AMBULATORY_CARE_PROVIDER_SITE_OTHER): Payer: Medicare Other | Admitting: Nurse Practitioner

## 2022-09-02 ENCOUNTER — Encounter: Payer: Self-pay | Admitting: Nurse Practitioner

## 2022-09-02 VITALS — BP 124/72 | HR 84 | Temp 97.4°F | Ht 66.0 in | Wt 146.0 lb

## 2022-09-02 DIAGNOSIS — F324 Major depressive disorder, single episode, in partial remission: Secondary | ICD-10-CM | POA: Diagnosis not present

## 2022-09-02 DIAGNOSIS — E782 Mixed hyperlipidemia: Secondary | ICD-10-CM | POA: Diagnosis not present

## 2022-09-02 DIAGNOSIS — I10 Essential (primary) hypertension: Secondary | ICD-10-CM | POA: Diagnosis not present

## 2022-09-02 MED ORDER — VALSARTAN-HYDROCHLOROTHIAZIDE 80-12.5 MG PO TABS: 1.0000 | ORAL_TABLET | Freq: Every day | ORAL | 1 refills | Status: DC

## 2022-09-02 MED ORDER — ROSUVASTATIN CALCIUM 5 MG PO TABS: 5.0000 mg | ORAL_TABLET | Freq: Every day | ORAL | 1 refills | Status: DC

## 2022-09-02 NOTE — Assessment & Plan Note (Signed)
PHQ 0  Continue Xanax 0.5 mg BD AS NEEDED  Continue Abilify  5 mg Continue Paxil to 20 mg

## 2022-09-02 NOTE — Assessment & Plan Note (Signed)
Start Diovan-HCT 80-12.5 mg dose Keep checking BP everyday  Nutrition: Stressed importance of moderation in sodium intake, saturated fat and cholesterol, caloric balance, sufficient intake of complex carbohydrates, fiber, calcium and iron.   Exercise: Stressed the importance of regular exercise.

## 2022-09-02 NOTE — Patient Instructions (Addendum)
Follow up in 3 months Continue taking medicine Continue watching your diet and taking medicines Start taking crestor 5 mg once daily, if in case there is a muscle pain, please take it with over the counter Co-Q10- 100 mg   DASH Eating Plan DASH stands for Dietary Approaches to Stop Hypertension. The DASH eating plan is a healthy eating plan that has been shown to: Reduce high blood pressure (hypertension). Reduce your risk for type 2 diabetes, heart disease, and stroke. Help with weight loss. What are tips for following this plan? Reading food labels Check food labels for the amount of salt (sodium) per serving. Choose foods with less than 5 percent of the Daily Value of sodium. Generally, foods with less than 300 milligrams (mg) of sodium per serving fit into this eating plan. To find whole grains, look for the word "whole" as the first word in the ingredient list. Shopping Buy products labeled as "low-sodium" or "no salt added." Buy fresh foods. Avoid canned foods and pre-made or frozen meals. Cooking Avoid adding salt when cooking. Use salt-free seasonings or herbs instead of table salt or sea salt. Check with your health care provider or pharmacist before using salt substitutes. Do not fry foods. Cook foods using healthy methods such as baking, boiling, grilling, roasting, and broiling instead. Cook with heart-healthy oils, such as olive, canola, avocado, soybean, or sunflower oil. Meal planning  Eat a balanced diet that includes: 4 or more servings of fruits and 4 or more servings of vegetables each day. Try to fill one-half of your plate with fruits and vegetables. 6-8 servings of whole grains each day. Less than 6 oz (170 g) of lean meat, poultry, or fish each day. A 3-oz (85-g) serving of meat is about the same size as a deck of cards. One egg equals 1 oz (28 g). 2-3 servings of low-fat dairy each day. One serving is 1 cup (237 mL). 1 serving of nuts, seeds, or beans 5 times each  week. 2-3 servings of heart-healthy fats. Healthy fats called omega-3 fatty acids are found in foods such as walnuts, flaxseeds, fortified milks, and eggs. These fats are also found in cold-water fish, such as sardines, salmon, and mackerel. Limit how much you eat of: Canned or prepackaged foods. Food that is high in trans fat, such as some fried foods. Food that is high in saturated fat, such as fatty meat. Desserts and other sweets, sugary drinks, and other foods with added sugar. Full-fat dairy products. Do not salt foods before eating. Do not eat more than 4 egg yolks a week. Try to eat at least 2 vegetarian meals a week. Eat more home-cooked food and less restaurant, buffet, and fast food. Lifestyle When eating at a restaurant, ask that your food be prepared with less salt or no salt, if possible. If you drink alcohol: Limit how much you use to: 0-1 drink a day for women who are not pregnant. 0-2 drinks a day for men. Be aware of how much alcohol is in your drink. In the [place] one drink equals one 12 oz bottle of beer (355 mL), one 5 oz glass of wine (148 mL), or one 1 oz glass of hard liquor (44 mL). General information Avoid eating more than 2,300 mg of salt a day. If you have hypertension, you may need to reduce your sodium intake to 1,500 mg a day. Work with your health care provider to maintain a healthy body weight or to lose weight. Ask what an  ideal weight is for you. Get at least 30 minutes of exercise that causes your heart to beat faster (aerobic exercise) most days of the week. Activities may include walking, swimming, or biking. Work with your health care provider or dietitian to adjust your eating plan to your individual calorie needs. What foods should I eat? Fruits All fresh, dried, or frozen fruit. Canned fruit in natural juice (without added sugar). Vegetables Fresh or frozen vegetables (raw, steamed, roasted, or grilled). Low-sodium or reduced-sodium tomato and  vegetable juice. Low-sodium or reduced-sodium tomato sauce and tomato paste. Low-sodium or reduced-sodium canned vegetables. Grains Whole-grain or whole-wheat bread. Whole-grain or whole-wheat pasta. Brown rice. [place]. Bulgur. Whole-grain and low-sodium cereals. Pita bread. Low-fat, low-sodium crackers. Whole-wheat flour tortillas. Meats and other proteins Skinless chicken or [place]. Ground chicken or [place]. Pork with fat trimmed off. Fish and seafood. Egg whites. Dried beans, peas, or lentils. Unsalted nuts, nut butters, and seeds. Unsalted canned beans. Lean cuts of beef with fat trimmed off. Low-sodium, lean precooked or cured meat, such as sausages or meat loaves. Dairy Low-fat (1%) or fat-free (skim) milk. Reduced-fat, low-fat, or fat-free cheeses. Nonfat, low-sodium ricotta or cottage cheese. Low-fat or nonfat yogurt. Low-fat, low-sodium cheese. Fats and oils Soft margarine without trans fats. Vegetable oil. Reduced-fat, low-fat, or light mayonnaise and salad dressings (reduced-sodium). Canola, safflower, olive, avocado, soybean, and sunflower oils. Avocado. Seasonings and condiments Herbs. Spices. Seasoning mixes without salt. Other foods Unsalted popcorn and pretzels. Fat-free sweets. The items listed above may not be a complete list of foods and beverages you can eat. Contact a dietitian for more information. What foods should I avoid? Fruits Canned fruit in a light or heavy syrup. Fried fruit. Fruit in cream or butter sauce. Vegetables Creamed or fried vegetables. Vegetables in a cheese sauce. Regular canned vegetables (not low-sodium or reduced-sodium). Regular canned tomato sauce and paste (not low-sodium or reduced-sodium). Regular tomato and vegetable juice (not low-sodium or reduced-sodium). Angie Fava. Olives. Grains Baked goods made with fat, such as croissants, muffins, or some breads. Dry pasta or rice meal packs. Meats and other proteins Fatty cuts of meat. Ribs.  Fried meat. Berniece Salines. Bologna, salami, and other precooked or cured meats, such as sausages or meat loaves. Fat from the back of a pig (fatback). Bratwurst. Salted nuts and seeds. Canned beans with added salt. Canned or smoked fish. Whole eggs or egg yolks. Chicken or [place] with skin. Dairy Whole or 2% milk, cream, and half-and-half. Whole or full-fat cream cheese. Whole-fat or sweetened yogurt. Full-fat cheese. Nondairy creamers. Whipped toppings. Processed cheese and cheese spreads. Fats and oils Butter. Stick margarine. Lard. Shortening. Ghee. Bacon fat. Tropical oils, such as coconut, palm kernel, or palm oil. Seasonings and condiments Onion salt, garlic salt, seasoned salt, table salt, and sea salt. Worcestershire sauce. Tartar sauce. Barbecue sauce. Teriyaki sauce. Soy sauce, including reduced-sodium. Steak sauce. Canned and packaged gravies. Fish sauce. Oyster sauce. Cocktail sauce. Store-bought horseradish. Ketchup. Mustard. Meat flavorings and tenderizers. Bouillon cubes. Hot sauces. Pre-made or packaged marinades. Pre-made or packaged taco seasonings. Relishes. Regular salad dressings. Other foods Salted popcorn and pretzels. The items listed above may not be a complete list of foods and beverages you should avoid. Contact a dietitian for more information. Where to find more information National Heart, Lung, and Blood Institute: https://wilson-eaton.com/ American Heart Association: www.heart.org Academy of Nutrition and Dietetics: www.eatright.[place]: www.kidney.org Summary The DASH eating plan is a healthy eating plan that has been shown to reduce high blood  pressure (hypertension). It may also reduce your risk for type 2 diabetes, heart disease, and stroke. When on the DASH eating plan, aim to eat more fresh fruits and vegetables, whole grains, lean proteins, low-fat dairy, and heart-healthy fats. With the DASH eating plan, you should limit salt (sodium) intake to 2,300 mg  a day. If you have hypertension, you may need to reduce your sodium intake to 1,500 mg a day. Work with your health care provider or dietitian to adjust your eating plan to your individual calorie needs. This information is not intended to replace advice given to you by your health care provider. Make sure you discuss any questions you have with your health care provider. Document Revised: 05/21/2019 Document Reviewed: 05/21/2019 Elsevier Patient Education  Cheryl Myers.

## 2022-09-02 NOTE — Assessment & Plan Note (Signed)
Crestor 5 mg started Advised to take it with Co-Q10 100 mg if in case muscle pain  Continue cutting off the butter and extra carb, please control the portion of diet

## 2022-09-02 NOTE — Progress Notes (Signed)
Subjective:  Patient ID: Cheryl Myers, female    DOB: 12/06/1945  Age: 77 y.o. MRN: [id number]  CHIEF COMPLAINT:  Follow up on her depression medicine and Blood pressure medicine  History of Present illness:  Patient is here after a dose adjustment in her depression medicine and adjusting BP medicine.She is taking Abilify 5 mg at bed time, alprazolam 0.5 mg two times daily AS NEEDED, Paxil 20 mg daily and says she is doing better on her depression.  For her HYPERTENSION:  She started Diovan-HCT 160-25 on date of 08/16/2022. and Patient had a recent eye surgery for her melanoma 2/22/ 2024  and she  passed out at 08/24/2022, patient said she did not hurt herself. She called her son and daughter in law at home but did not inform to her PCP. She denies nausea, vomiting, headache. Patient said her appetitie was not great after surgery and she was not drinking enough, she thinks may be that was the reason of her passing out. Home reading BP were 116/67, 94/64, 114/80, 129//72. She denies chest pain, palpitation, SHORTNESS OF BREATH and distress.    Current Outpatient Medications on File Prior to Visit  Medication Sig Dispense Refill   ALPRAZolam (XANAX) 0.5 MG tablet Take 1 tablet (0.5 mg total) by mouth 2 (two) times daily as needed. for anxiety 60 tablet 3   ARIPiprazole (ABILIFY) 5 MG tablet Take 1 tablet (5 mg total) by mouth daily. 90 tablet 0   PARoxetine (PAXIL) 20 MG tablet Take 1 tablet (20 mg total) by mouth daily. 90 tablet 0   No current facility-administered medications on file prior to visit.   Past Medical History:  Diagnosis Date   Anxiety    Cancer ([place])    Basal and squamous cell skin cancer   Osteopenia 08/2014   T score -1.9 FRAX 11%/1.8%   Past Surgical History:  Procedure Laterality Date   Basal and squamous cell skin cancers excised      OVARIAN CYST REMOVAL      Family History  Problem Relation Age of Onset   Diabetes Mother    Alzheimer's disease Mother     Melanoma Brother    Diabetes Maternal Aunt    Melanoma Maternal Uncle    Diabetes Maternal Uncle    Heart disease Maternal Uncle    Melanoma Maternal Uncle    Heart disease Maternal Uncle    Social History   Socioeconomic History   Marital status: Married    Spouse name: Not on file   Number of children: Not on file   Years of education: Not on file   Highest education level: Not on file  Occupational History   Not on file  Tobacco Use   Smoking status: Never   Smokeless tobacco: Never  Vaping Use   Vaping Use: Never used  Substance and Sexual Activity   Alcohol use: Yes    Alcohol/week: 1.0 standard drink of alcohol    Types: 1 Glasses of wine per week    Comment: socially   Drug use: No   Sexual activity: Not Currently    Birth control/protection: Post-menopausal    Comment: 1st intercourse 67 yo--1 partner  Other Topics Concern   Not on file  Social History Narrative   Not on file   Social Determinants of Health   Financial Resource Strain: Low Risk  (04/13/2021)   Overall Financial Resource Strain (CARDIA)    Difficulty of Paying Living Expenses: Not hard at all  Food Insecurity: No Food Insecurity (09/02/2022)   Hunger Vital Sign    Worried About Running Out of Food in the Last Year: Never true    Ran Out of Food in the Last Year: Never true  Transportation Needs: No Transportation Needs (09/02/2022)   PRAPARE - Hydrologist (Medical): No    Lack of Transportation (Non-Medical): No  Physical Activity: Insufficiently Active (09/02/2022)   Exercise Vital Sign    Days of Exercise per Week: 3 days    Minutes of Exercise per Session: 20 min  Stress: No Stress Concern Present (09/02/2022)   [place]    Feeling of Stress : Not at all  Social Connections: Moderately Isolated (09/02/2022)   Social Connection and Isolation Panel [NHANES]    Frequency of Communication with  Friends and Family: More than three times a week    Frequency of Social Gatherings with Friends and Family: More than three times a week    Attends Religious Services: More than 4 times per year    Active Member of [employer] or Organizations: No    Attends Archivist Meetings: Never    Marital Status: Widowed    Review of Systems  Constitutional:  Negative for appetite change, chills, fatigue and fever.  HENT:  Negative for congestion, ear pain, postnasal drip, rhinorrhea, sinus pressure, sinus pain and sore throat.   Respiratory:  Negative for cough and shortness of breath.   Cardiovascular:  Negative for chest pain and palpitations.  Gastrointestinal:  Negative for abdominal pain, constipation, diarrhea, nausea and vomiting.  Genitourinary:  Negative for dysuria and hematuria.  Musculoskeletal:  Negative for arthralgias, back pain, joint swelling and myalgias.  Skin:  Negative for rash.  Neurological:  Negative for dizziness, weakness and headaches.  Psychiatric/Behavioral:  Negative for dysphoric mood. The patient is not nervous/anxious.      Objective:  BP 124/72   Pulse 84   Temp (!) 97.4 F (36.3 C)   Ht '5\' 6"'$  (1.676 m)   Wt 146 lb (66.2 kg)   SpO2 99%   BMI 23.57 kg/m   Physical Exam Vitals and nursing note reviewed.  Constitutional:      Appearance: Normal appearance.  Eyes:     Comments: Surgical scar in right eye  Cardiovascular:     Rate and Rhythm: Normal rate and regular rhythm.  Pulmonary:     Effort: Pulmonary effort is normal.     Breath sounds: Normal breath sounds.  Abdominal:     General: There is no distension.     Palpations: Abdomen is soft.     Tenderness: There is no abdominal tenderness.  Skin:    General: Skin is warm.     Findings: Bruising (post fall bruises right arm, right and left hip, healing) present.  Neurological:     Mental Status: She is alert and oriented to person, place, and time.  Psychiatric:        Mood and Affect:  Mood normal.        Behavior: Behavior normal.        Thought Content: Thought content normal.          09/02/2022    3:17 PM 08/16/2022   11:23 AM 03/05/2022    2:03 PM  BP/Weight  Systolic BP [id number]  Diastolic BP 72 80 70  Wt. (Lbs) 146 149 150  BMI 23.57 kg/m2 23.34 kg/m2 23.49 kg/m2  09/02/2022    3:18 PM 08/16/2022   12:10 PM 03/05/2022    2:07 PM 11/27/2021    2:20 PM 08/27/2021    1:50 PM  Depression screen PHQ 2/9  Decreased Interest 0 '1 1 1 '$ 0  Down, Depressed, Hopeless 0 1 0 1 0  PHQ - 2 Score 0 '2 1 2 '$ 0  Altered sleeping 0 1 0 3 2  Tired, decreased energy 0 1 0 1 0  Change in appetite 0 0 0 2 1  Feeling bad or failure about yourself  0 0 0 3 0  Trouble concentrating   0 2 0  Moving slowly or fidgety/restless 0 0 0 1 0  Suicidal thoughts 0 0 0 0 0  PHQ-9 Score 0 '4 1 14 3  '$ Difficult doing work/chores Not difficult at all Somewhat difficult Somewhat difficult Not difficult at all Not difficult at all       09/20/2019   10:22 AM 12/15/2019   11:34 AM 03/21/2021    1:31 PM 08/16/2022   12:13 PM 09/02/2022    3:18 PM  Fall Risk  Falls in the past year? 1 0 0 0 1  Was there an injury with Fall? 0 0 0 0 0  Fall Risk Category Calculator 1 0 0 0 1  Fall Risk Category (Retired) Low Low Low    (RETIRED) Patient Fall Risk Level  Low fall risk Low fall risk    Patient at Risk for Falls Due to Other (Comment)  No Fall Risks No Fall Risks;Other (Comment) No Fall Risks  Fall risk Follow up  Falls evaluation completed Education provided Follow up appointment Falls evaluation completed    Lab Results  Component Value Date   WBC 3.9 08/16/2022   HGB 12.1 08/16/2022   HCT 36.7 08/16/2022   PLT 245 08/16/2022   GLUCOSE 94 08/16/2022   CHOL 262 (H) 08/16/2022   TRIG 127 08/16/2022   HDL 64 08/16/2022   LDLCALC 175 (H) 08/16/2022   ALT 13 08/16/2022   AST 18 08/16/2022   NA 143 08/16/2022   K 5.4 (H) 08/16/2022   CL 103 08/16/2022   CREATININE 0.95 08/16/2022   BUN  14 08/16/2022   CO2 21 08/16/2022   TSH 2.990 08/16/2022      Assessment & Plan:   HTN (hypertension), benign Assessment & Plan: Start Diovan-HCT 80-12.5 mg dose Keep checking BP everyday  Nutrition: Stressed importance of moderation in sodium intake, saturated fat and cholesterol, caloric balance, sufficient intake of complex carbohydrates, fiber, calcium and iron.   Exercise: Stressed the importance of regular exercise.     Orders: -     Valsartan-hydroCHLOROthiazide; Take 1 tablet by mouth daily.  Dispense: 90 tablet; Refill: 1  Mixed hyperlipidemia Assessment & Plan: Crestor 5 mg started Advised to take it with Co-Q10 100 mg if in case muscle pain  Continue cutting off the butter and extra carb, please control the portion of diet  Orders: -     Rosuvastatin Calcium; Take 1 tablet (5 mg total) by mouth daily.  Dispense: 90 tablet; Refill: 1  Depression, major, single episode, in partial remission (HCC) Assessment & Plan: PHQ 0  Continue Xanax 0.5 mg BD AS NEEDED  Continue Abilify  5 mg Continue Paxil to 20 mg       Follow-up: Return in about 3 months (around 12/03/2022) for CHRONIC, FASTING.  An After Visit Summary was printed and given to the patient.  I, Neil Crouch have reviewed  all documentation for this visit. The documentation on 09/02/22   for the exam, diagnosis, procedures, and orders are all accurate and complete.    Neil Crouch, DNP, [facility] [phone]

## 2022-09-09 ENCOUNTER — Ambulatory Visit: Payer: Medicare Other | Admitting: Nurse Practitioner

## 2022-09-20 ENCOUNTER — Other Ambulatory Visit: Payer: Self-pay

## 2022-09-20 DIAGNOSIS — F4321 Adjustment disorder with depressed mood: Secondary | ICD-10-CM

## 2022-09-20 MED ORDER — ALPRAZOLAM 0.5 MG PO TABS: 0.5000 mg | ORAL_TABLET | Freq: Two times a day (BID) | ORAL | 0 refills | Status: DC | PRN

## 2022-10-10 ENCOUNTER — Ambulatory Visit: Payer: Medicare Other | Admitting: Nurse Practitioner

## 2022-10-15 ENCOUNTER — Telehealth: Payer: Self-pay

## 2022-10-15 NOTE — Telephone Encounter (Signed)
I was unable to leave a message on the patients phone. The provider is out of the office for 12/10/2022. Appointment has been canceled and rescheduled to July 2 at 11:00 AM. I have mailed the patient a letter and a appointment card

## 2022-10-29 ENCOUNTER — Other Ambulatory Visit: Payer: Self-pay | Admitting: Family Medicine

## 2022-10-29 DIAGNOSIS — F4321 Adjustment disorder with depressed mood: Secondary | ICD-10-CM

## 2022-11-18 ENCOUNTER — Other Ambulatory Visit: Payer: Self-pay

## 2022-11-18 DIAGNOSIS — F324 Major depressive disorder, single episode, in partial remission: Secondary | ICD-10-CM

## 2022-11-18 MED ORDER — PAROXETINE HCL 20 MG PO TABS: 20.0000 mg | ORAL_TABLET | Freq: Every day | ORAL | 0 refills | Status: DC

## 2022-11-28 ENCOUNTER — Other Ambulatory Visit: Payer: Self-pay | Admitting: Family Medicine

## 2022-11-28 DIAGNOSIS — F4321 Adjustment disorder with depressed mood: Secondary | ICD-10-CM

## 2022-12-10 ENCOUNTER — Ambulatory Visit: Payer: Medicare Other | Admitting: Family Medicine

## 2022-12-27 ENCOUNTER — Other Ambulatory Visit: Payer: Self-pay | Admitting: Family Medicine

## 2022-12-27 DIAGNOSIS — F4321 Adjustment disorder with depressed mood: Secondary | ICD-10-CM

## 2022-12-30 NOTE — Progress Notes (Unsigned)
Subjective:  Patient ID: Cheryl Myers, female    DOB: 03/18/1946  Age: 77 y.o. MRN: [id number]  Chief Complaint  Patient presents with   Medical Management of Chronic Issues    HPI   Hypertension:  Valsartan-HCT 80/12.5 mg daily. She has been having episodes of feeling light-headed when standing.  She has been careful to stand up slowly.    Hyperipidemia:  Crestor 5 mg daily.    GAD:  paroxetine 20 mg daily, abilify 5 mg daily, and xanax 0.5 mg twice daily.        12/31/2022   10:59 AM 09/02/2022    3:18 PM 08/16/2022   12:10 PM 03/05/2022    2:07 PM 11/27/2021    2:20 PM  Depression screen PHQ 2/9  Decreased Interest 1 0 1 1 1   Down, Depressed, Hopeless 1 0 1 0 1  PHQ - 2 Score 2 0 2 1 2   Altered sleeping 2 0 1 0 3  Tired, decreased energy 0 0 1 0 1  Change in appetite 1 0 0 0 2  Feeling bad or failure about yourself  0 0 0 0 3  Trouble concentrating 0   0 2  Moving slowly or fidgety/restless 0 0 0 0 1  Suicidal thoughts 0 0 0 0 0  PHQ-9 Score 5 0 4 1 14   Difficult doing work/chores Not difficult at all Not difficult at all Somewhat difficult Somewhat difficult Not difficult at all        12/31/2022   10:59 AM  Fall Risk   Falls in the past year? 1  Number falls in past yr: 0  Injury with Fall? 0  Risk for fall due to : No Fall Risks  Follow up Falls evaluation completed;Falls prevention discussed    Patient Care Team: Rivaldo Hineman, Fritzi Mandes, MD as PCP - General (Family Medicine)   Review of Systems  Constitutional:  Negative for chills, fatigue and fever.  HENT:  Negative for congestion, rhinorrhea and sore throat.   Respiratory:  Negative for cough and shortness of breath.   Cardiovascular:  Negative for chest pain.  Gastrointestinal:  Negative for abdominal pain, constipation, diarrhea, nausea and vomiting.  Genitourinary:  Negative for dysuria and urgency.  Musculoskeletal:  Positive for myalgias. Negative for back pain.  Neurological:  Positive for light-headedness.  Negative for dizziness, weakness and headaches.  Psychiatric/Behavioral:  Negative for dysphoric mood. The patient is not nervous/anxious.     Current Outpatient Medications on File Prior to Visit  Medication Sig Dispense Refill   ALPRAZolam (XANAX) 0.5 MG tablet TAKE ONE TABLET BY MOUTH TWICE DAILY AS NEEDED FOR anxiety 60 tablet 0   ARIPiprazole (ABILIFY) 5 MG tablet Take 1 tablet (5 mg total) by mouth daily. 90 tablet 0   PARoxetine (PAXIL) 20 MG tablet Take 1 tablet (20 mg total) by mouth daily. 90 tablet 0   rosuvastatin (CRESTOR) 5 MG tablet Take 1 tablet (5 mg total) by mouth daily. 90 tablet 1   No current facility-administered medications on file prior to visit.   Past Medical History:  Diagnosis Date   Anxiety    Cancer (HCC)    Basal and squamous cell skin cancer   Osteopenia 08/2014   T score -1.9 FRAX 11%/1.8%   Past Surgical History:  Procedure Laterality Date   Basal and squamous cell skin cancers excised      OVARIAN CYST REMOVAL      Family History  Problem Relation Age of Onset  Diabetes Mother    Alzheimer's disease Mother    Melanoma Brother    Diabetes Maternal Aunt    Melanoma Maternal Uncle    Diabetes Maternal Uncle    Heart disease Maternal Uncle    Melanoma Maternal Uncle    Heart disease Maternal Uncle    Social History   Socioeconomic History   Marital status: Married    Spouse name: Not on file   Number of children: Not on file   Years of education: Not on file   Highest education level: Not on file  Occupational History   Not on file  Tobacco Use   Smoking status: Never   Smokeless tobacco: Never  Vaping Use   Vaping Use: Never used  Substance and Sexual Activity   Alcohol use: Yes    Alcohol/week: 1.0 standard drink of alcohol    Types: 1 Glasses of wine per week    Comment: socially   Drug use: No   Sexual activity: Not Currently    Birth control/protection: Post-menopausal    Comment: 1st intercourse 85 yo--1 partner   Other Topics Concern   Not on file  Social History Narrative   Not on file   Social Determinants of Health   Financial Resource Strain: Low Risk  (04/13/2021)   Overall Financial Resource Strain (CARDIA)    Difficulty of Paying Living Expenses: Not hard at all  Food Insecurity: No Food Insecurity (09/02/2022)   Hunger Vital Sign    Worried About Running Out of Food in the Last Year: Never true    Ran Out of Food in the Last Year: Never true  Transportation Needs: No Transportation Needs (09/02/2022)   PRAPARE - Administrator, Civil Service (Medical): No    Lack of Transportation (Non-Medical): No  Physical Activity: Insufficiently Active (09/02/2022)   Exercise Vital Sign    Days of Exercise per Week: 3 days    Minutes of Exercise per Session: 20 min  Stress: No Stress Concern Present (09/02/2022)   [employer] of Occupational Health - Occupational Stress Questionnaire    Feeling of Stress : Not at all  Social Connections: Moderately Isolated (09/02/2022)   Social Connection and Isolation Panel [NHANES]    Frequency of Communication with Friends and Family: More than three times a week    Frequency of Social Gatherings with Friends and Family: More than three times a week    Attends Religious Services: More than 4 times per year    Active Member of [employer] or Organizations: No    Attends Banker Meetings: Never    Marital Status: Widowed    Objective:  BP (!) 144/72   Pulse 64   Temp (!) 95 F (35 C)   Resp 14   Ht 5\' 7"  (1.702 m)   Wt 149 lb (67.6 kg)   BMI 23.34 kg/m      12/31/2022   10:53 AM 09/02/2022    3:17 PM 08/16/2022   11:23 AM  BP/Weight  Systolic BP 144 124 150  Diastolic BP 72 72 80  Wt. (Lbs) 149 146 149  BMI 23.34 kg/m2 23.57 kg/m2 23.34 kg/m2    Physical Exam Vitals reviewed.  Constitutional:      Appearance: Normal appearance.  Neck:     Vascular: No carotid bruit.  Cardiovascular:     Rate and Rhythm: Normal rate and  regular rhythm.     Pulses: Normal pulses.     Heart sounds: Normal heart  sounds.  Pulmonary:     Effort: Pulmonary effort is normal.     Breath sounds: Normal breath sounds.  Abdominal:     General: Bowel sounds are normal.     Palpations: Abdomen is soft.     Tenderness: There is no abdominal tenderness.  Neurological:     Mental Status: She is alert and oriented to person, place, and time.  Psychiatric:        Mood and Affect: Mood normal.        Behavior: Behavior normal.     Diabetic Foot Exam - Simple   No data filed      Lab Results  Component Value Date   WBC 4.2 12/31/2022   HGB 11.4 12/31/2022   HCT 34.8 12/31/2022   PLT 217 12/31/2022   GLUCOSE 89 12/31/2022   CHOL 151 12/31/2022   TRIG 98 12/31/2022   HDL 67 12/31/2022   LDLCALC 66 12/31/2022   ALT 12 12/31/2022   AST 18 12/31/2022   NA 138 12/31/2022   K 4.7 12/31/2022   CL 99 12/31/2022   CREATININE 1.04 (H) 12/31/2022   BUN 13 12/31/2022   CO2 22 12/31/2022   TSH 2.990 08/16/2022      Assessment & Plan:    HTN (hypertension), benign Assessment & Plan: Start valsartan 160 mg daily.  Stop valsartan/hydrochlorothiazide  80/12.5 mg.  Change positions slowly. Continue to work on eating a healthy diet and exercise.  Labs drawn today.      Orders: -     CBC with Differential/Platelet -     Comprehensive metabolic panel -     Valsartan; Take 1 tablet (160 mg total) by mouth daily.  Dispense: 90 tablet; Refill: 0 -     EKG 12-Lead -     [facility] CKD [facility]  Mixed hyperlipidemia Assessment & Plan: Well controlled.  No changes to medicines. Rosuvastatin 5 mg daily Continue to work on eating a healthy diet and exercise.  Labs drawn today.    Orders: -     Lipid panel  GAD (generalized anxiety disorder) Assessment & Plan: The current medical regimen is effective;  continue present plan and medications.    Other fatigue Assessment & Plan: Order EKG  Orders: -     EKG 12-Lead      Meds ordered this encounter  Medications   valsartan (DIOVAN) 160 MG tablet    Sig: Take 1 tablet (160 mg total) by mouth daily.    Dispense:  90 tablet    Refill:  0    Orders Placed This Encounter  Procedures   CBC with Differential/Platelet   Comprehensive metabolic panel   Lipid panel   [facility] CKD [facility]   EKG 12-Lead     Follow-up: Return in about 5 weeks (around 02/04/2023) for awv.   I,Marla I Leal-Borjas,acting as a scribe for Blane Ohara, MD.,have documented all relevant documentation on the behalf of Blane Ohara, MD,as directed by  Blane Ohara, MD while in the presence of Blane Ohara, MD.   An After Visit Summary was printed and given to the patient.  Blane Ohara, MD [facility] [phone]

## 2022-12-30 NOTE — Assessment & Plan Note (Signed)
Well controlled.  No changes to medicines. Rosuvastatin 5 mg daily. Continue to work on eating a healthy diet and exercise.  Labs drawn today.  

## 2022-12-30 NOTE — Assessment & Plan Note (Addendum)
Start valsartan 160 mg daily.  Stop valsartan/hydrochlorothiazide  80/12.5 mg.  Change positions slowly. Continue to work on eating a healthy diet and exercise.  Labs drawn today.

## 2022-12-30 NOTE — Assessment & Plan Note (Signed)
The current medical regimen is effective;  continue present plan and medications.  

## 2022-12-31 ENCOUNTER — Ambulatory Visit (INDEPENDENT_AMBULATORY_CARE_PROVIDER_SITE_OTHER): Payer: Medicare Other | Admitting: Family Medicine

## 2022-12-31 ENCOUNTER — Encounter: Payer: Self-pay | Admitting: Family Medicine

## 2022-12-31 VITALS — BP 144/72 | HR 64 | Temp 95.0°F | Resp 14 | Ht 67.0 in | Wt 149.0 lb

## 2022-12-31 DIAGNOSIS — E782 Mixed hyperlipidemia: Secondary | ICD-10-CM

## 2022-12-31 DIAGNOSIS — I10 Essential (primary) hypertension: Secondary | ICD-10-CM | POA: Diagnosis not present

## 2022-12-31 DIAGNOSIS — F411 Generalized anxiety disorder: Secondary | ICD-10-CM | POA: Diagnosis not present

## 2022-12-31 DIAGNOSIS — R5383 Other fatigue: Secondary | ICD-10-CM

## 2022-12-31 MED ORDER — VALSARTAN 160 MG PO TABS
160.0000 mg | ORAL_TABLET | Freq: Every day | ORAL | 0 refills | Status: DC
Start: 2022-12-31 — End: 2023-03-21

## 2022-12-31 NOTE — Patient Instructions (Signed)
Start valsartan 160 mg daily.  Stop valsartan/hydrochlorothiazide  80/12.5 mg.  Change positions slowly.

## 2023-01-01 LAB — COMPREHENSIVE METABOLIC PANEL
ALT: 12 IU/L (ref 0–32)
AST: 18 IU/L (ref 0–40)
Albumin: 4.5 g/dL (ref 3.8–4.8)
Alkaline Phosphatase: 85 IU/L (ref 44–121)
BUN/Creatinine Ratio: 13 (ref 12–28)
BUN: 13 mg/dL (ref 8–27)
Bilirubin Total: 0.6 mg/dL (ref 0.0–1.2)
CO2: 22 mmol/L (ref 20–29)
Calcium: 10 mg/dL (ref 8.7–10.3)
Chloride: 99 mmol/L (ref 96–106)
Creatinine, Ser: 1.04 mg/dL — ABNORMAL HIGH (ref 0.57–1.00)
Globulin, Total: 2.3 g/dL (ref 1.5–4.5)
Glucose: 89 mg/dL (ref 70–99)
Potassium: 4.7 mmol/L (ref 3.5–5.2)
Sodium: 138 mmol/L (ref 134–144)
Total Protein: 6.8 g/dL (ref 6.0–8.5)
eGFR: 56 mL/min/{1.73_m2} — ABNORMAL LOW (ref >59)

## 2023-01-01 LAB — CBC WITH DIFFERENTIAL/PLATELET
Basophils Absolute: 0 10*3/uL (ref 0.0–0.2)
Basos: 1 %
EOS (ABSOLUTE): 0.1 10*3/uL (ref 0.0–0.4)
Eos: 2 %
Hematocrit: 34.8 % (ref 34.0–46.6)
Hemoglobin: 11.4 g/dL (ref 11.1–15.9)
Immature Grans (Abs): 0 10*3/uL (ref 0.0–0.1)
Immature Granulocytes: 0 %
Lymphocytes Absolute: 1.2 10*3/uL (ref 0.7–3.1)
Lymphs: 28 %
MCH: 30.4 pg (ref 26.6–33.0)
MCHC: 32.8 g/dL (ref 31.5–35.7)
MCV: 93 fL (ref 79–97)
Monocytes Absolute: 0.4 10*3/uL (ref 0.1–0.9)
Monocytes: 9 %
Neutrophils Absolute: 2.5 10*3/uL (ref 1.4–7.0)
Neutrophils: 60 %
Platelets: 217 10*3/uL (ref 150–450)
RBC: 3.75 x10E6/uL — ABNORMAL LOW (ref 3.77–5.28)
RDW: 12.4 % (ref 11.7–15.4)
WBC: 4.2 10*3/uL (ref 3.4–10.8)

## 2023-01-01 LAB — LIPID PANEL
Chol/HDL Ratio: 2.3 ratio (ref 0.0–4.4)
Cholesterol, Total: 151 mg/dL (ref 100–199)
HDL: 67 mg/dL (ref >39)
LDL Chol Calc (NIH): 66 mg/dL (ref 0–99)
Triglycerides: 98 mg/dL (ref 0–149)
VLDL Cholesterol Cal: 18 mg/dL (ref 5–40)

## 2023-01-01 LAB — LITHOLINK CKD PROGRAM

## 2023-01-03 DIAGNOSIS — R5383 Other fatigue: Secondary | ICD-10-CM | POA: Insufficient documentation

## 2023-01-03 NOTE — Assessment & Plan Note (Signed)
Order EKG

## 2023-01-05 ENCOUNTER — Encounter: Payer: Self-pay | Admitting: Family Medicine

## 2023-01-27 ENCOUNTER — Other Ambulatory Visit: Payer: Self-pay | Admitting: Family Medicine

## 2023-01-27 DIAGNOSIS — F4321 Adjustment disorder with depressed mood: Secondary | ICD-10-CM

## 2023-02-10 ENCOUNTER — Ambulatory Visit: Payer: Medicare Other

## 2023-02-10 VITALS — BP 120/70 | HR 78 | Temp 98.1°F | Resp 14 | Ht 67.0 in | Wt 150.0 lb

## 2023-02-10 DIAGNOSIS — Z0000 Encounter for general adult medical examination without abnormal findings: Secondary | ICD-10-CM

## 2023-02-10 NOTE — Patient Instructions (Addendum)
Recommend to take at least 1000-1200 mg of calcium along with vitamin D3 1000-2000 international units   Health Maintenance, Female Adopting a healthy lifestyle and getting preventive care are important in promoting health and wellness. Ask your health care provider about: The right schedule for you to have regular tests and exams. Things you can do on your own to prevent diseases and keep yourself healthy. What should I know about diet, weight, and exercise? Eat a healthy diet  Eat a diet that includes plenty of vegetables, fruits, low-fat dairy products, and lean protein. Do not eat a lot of foods that are high in solid fats, added sugars, or sodium. Maintain a healthy weight Body mass index (BMI) is used to identify weight problems. It estimates body fat based on height and weight. Your health care provider can help determine your BMI and help you achieve or maintain a healthy weight. Get regular exercise Get regular exercise. This is one of the most important things you can do for your health. Most adults should: Exercise for at least 150 minutes each week. The exercise should increase your heart rate and make you sweat (moderate-intensity exercise). Do strengthening exercises at least twice a week. This is in addition to the moderate-intensity exercise. Spend less time sitting. Even light physical activity can be beneficial. Watch cholesterol and blood lipids Have your blood tested for lipids and cholesterol at 76 years of age, then have this test every 5 years. Have your cholesterol levels checked more often if: Your lipid or cholesterol levels are high. You are older than 76 years of age. You are at high risk for heart disease. What should I know about cancer screening? Depending on your health history and family history, you may need to have cancer screening at various ages. This may include screening for: Breast cancer. Cervical cancer. Colorectal cancer. Skin cancer. Lung  cancer. What should I know about heart disease, diabetes, and high blood pressure? Blood pressure and heart disease High blood pressure causes heart disease and increases the risk of stroke. This is more likely to develop in people who have high blood pressure readings or are overweight. Have your blood pressure checked: Every 3-5 years if you are 30-41 years of age. Every year if you are 60 years old or older. Diabetes Have regular diabetes screenings. This checks your fasting blood sugar level. Have the screening done: Once every three years after age 106 if you are at a normal weight and have a low risk for diabetes. More often and at a younger age if you are overweight or have a high risk for diabetes. What should I know about preventing infection? Hepatitis B If you have a higher risk for hepatitis B, you should be screened for this virus. Talk with your health care provider to find out if you are at risk for hepatitis B infection. Hepatitis C Testing is recommended for: Everyone born from [birth year] through 1964. Anyone with known risk factors for hepatitis C. Sexually transmitted infections (STIs) Get screened for STIs, including gonorrhea and chlamydia, if: You are sexually active and are younger than 76 years of age. You are older than 76 years of age and your health care provider tells you that you are at risk for this type of infection. Your sexual activity has changed since you were last screened, and you are at increased risk for chlamydia or gonorrhea. Ask your health care provider if you are at risk. Ask your health care provider about whether you are  at high risk for HIV. Your health care provider may recommend a prescription medicine to help prevent HIV infection. If you choose to take medicine to prevent HIV, you should first get tested for HIV. You should then be tested every 3 months for as long as you are taking the medicine. Pregnancy If you are about to stop having your  period (premenopausal) and you may become pregnant, seek counseling before you get pregnant. Take 400 to 800 micrograms (mcg) of folic acid every day if you become pregnant. Ask for birth control (contraception) if you want to prevent pregnancy. Osteoporosis and menopause Osteoporosis is a disease in which the bones lose minerals and strength with aging. This can result in bone fractures. If you are 56 years old or older, or if you are at risk for osteoporosis and fractures, ask your health care provider if you should: Be screened for bone loss. Take a calcium or vitamin D supplement to lower your risk of fractures. Be given hormone replacement therapy (HRT) to treat symptoms of menopause. Follow these instructions at home: Alcohol use Do not drink alcohol if: Your health care provider tells you not to drink. You are pregnant, may be pregnant, or are planning to become pregnant. If you drink alcohol: Limit how much you have to: 0-1 drink a day. Know how much alcohol is in your drink. In the [place] one drink equals one 12 oz bottle of beer (355 mL), one 5 oz glass of wine (148 mL), or one 1 oz glass of hard liquor (44 mL). Lifestyle Do not use any products that contain nicotine or tobacco. These products include cigarettes, chewing tobacco, and vaping devices, such as e-cigarettes. If you need help quitting, ask your health care provider. Do not use street drugs. Do not share needles. Ask your health care provider for help if you need support or information about quitting drugs. General instructions Schedule regular health, dental, and eye exams. Stay current with your vaccines. Tell your health care provider if: You often feel depressed. You have ever been abused or do not feel safe at home. Summary Adopting a healthy lifestyle and getting preventive care are important in promoting health and wellness. Follow your health care provider's instructions about healthy diet, exercising, and  getting tested or screened for diseases. Follow your health care provider's instructions on monitoring your cholesterol and blood pressure. This information is not intended to replace advice given to you by your health care provider. Make sure you discuss any questions you have with your health care provider. Document Revised: 11/06/2020 Document Reviewed: 11/06/2020 Elsevier Patient Education  2024 [employer].

## 2023-02-10 NOTE — Progress Notes (Signed)
Subjective:   Cheryl Myers is a 77 y.o. female who presents for an Initial Medicare Annual Wellness Visit.  Visit Complete: In person  Patient Medicare AWV questionnaire was completed by the patient; I have confirmed that all information answered by patient is correct and no changes since this date.    Cardiac Risk Factors include: advanced age (>14men, >17 women);hypertension;sedentary lifestyle     Objective:    Today's Vitals   02/10/23 1530  BP: 120/70  Pulse: 78  Resp: 14  Temp: 98.1 F (36.7 C)  SpO2: 99%  Weight: 150 lb (68 kg)  Height: 5\' 7"  (1.702 m)   Body mass index is 23.49 kg/m.     02/10/2023    3:36 PM 01/18/2018   12:18 AM  Advanced Directives  Does Patient Have a Medical Advance Directive? No No  Would patient like information on creating a medical advance directive?  No - Patient declined    Current Medications (verified) Outpatient Encounter Medications as of 02/10/2023  Medication Sig   ALPRAZolam (XANAX) 0.5 MG tablet TAKE ONE TABLET BY MOUTH TWICE DAILY AS NEEDED FOR anxiety   ARIPiprazole (ABILIFY) 2 MG tablet Take 2 mg by mouth at bedtime as needed.   PARoxetine (PAXIL) 20 MG tablet Take 1 tablet (20 mg total) by mouth daily.   valsartan (DIOVAN) 160 MG tablet Take 1 tablet (160 mg total) by mouth daily.   [DISCONTINUED] ARIPiprazole (ABILIFY) 5 MG tablet Take 1 tablet (5 mg total) by mouth daily.   [DISCONTINUED] rosuvastatin (CRESTOR) 5 MG tablet Take 1 tablet (5 mg total) by mouth daily.   No facility-administered encounter medications on file as of 02/10/2023.    Allergies (verified) Patient has no known allergies.   History: Past Medical History:  Diagnosis Date   Anxiety    Cancer (HCC)    Basal and squamous cell skin cancer   Osteopenia 08/2014   T score -1.9 FRAX 11%/1.8%   Past Surgical History:  Procedure Laterality Date   Basal and squamous cell skin cancers excised      OVARIAN CYST REMOVAL     Family History   Problem Relation Age of Onset   Diabetes Mother    Alzheimer's disease Mother    Melanoma Brother    Diabetes Maternal Aunt    Melanoma Maternal Uncle    Diabetes Maternal Uncle    Heart disease Maternal Uncle    Melanoma Maternal Uncle    Heart disease Maternal Uncle    Social History   Socioeconomic History   Marital status: Married    Spouse name: Not on file   Number of children: Not on file   Years of education: Not on file   Highest education level: Not on file  Occupational History   Not on file  Tobacco Use   Smoking status: Never   Smokeless tobacco: Never  Vaping Use   Vaping status: Never Used  Substance and Sexual Activity   Alcohol use: Yes    Alcohol/week: 1.0 standard drink of alcohol    Types: 1 Glasses of wine per week    Comment: socially   Drug use: No   Sexual activity: Not Currently    Birth control/protection: Post-menopausal    Comment: 1st intercourse 58 yo--1 partner  Other Topics Concern   Not on file  Social History Narrative   Not on file   Social Determinants of Health   Financial Resource Strain: Low Risk  (02/10/2023)   Overall Financial  Resource Strain (CARDIA)    Difficulty of Paying Living Expenses: Not hard at all  Food Insecurity: No Food Insecurity (02/10/2023)   Hunger Vital Sign    Worried About Running Out of Food in the Last Year: Never true    Ran Out of Food in the Last Year: Never true  Transportation Needs: No Transportation Needs (02/10/2023)   PRAPARE - Administrator, Civil Service (Medical): No    Lack of Transportation (Non-Medical): No  Physical Activity: Insufficiently Active (02/10/2023)   Exercise Vital Sign    Days of Exercise per Week: 3 days    Minutes of Exercise per Session: 20 min  Stress: No Stress Concern Present (02/10/2023)   [employer] of Occupational Health - Occupational Stress Questionnaire    Feeling of Stress : Not at all  Social Connections: Moderately Isolated (02/10/2023)    Social Connection and Isolation Panel [NHANES]    Frequency of Communication with Friends and Family: More than three times a week    Frequency of Social Gatherings with Friends and Family: More than three times a week    Attends Religious Services: More than 4 times per year    Active Member of [employer] or Organizations: No    Attends Banker Meetings: Never    Marital Status: Widowed    Tobacco Counseling Counseling given: Not Answered   Clinical Intake:  Pre-visit preparation completed: Yes  Pain : No/denies pain     BMI - recorded: 23.49 Nutritional Status: BMI of 19-24  Normal Nutritional Risks: None Diabetes: No  How often do you need to have someone help you when you read instructions, pamphlets, or other written materials from your doctor or pharmacy?: 1 - Never What is the last grade level you completed in school?: cosmetology school.  Interpreter Needed?: No      Activities of Daily Living    02/10/2023    3:38 PM 09/02/2022    3:20 PM  In your present state of health, do you have any difficulty performing the following activities:  Hearing? 0 0  Vision? 1 0  Comment read   Difficulty concentrating or making decisions? 0 0  Walking or climbing stairs? 0 0  Dressing or bathing? 0 0  Doing errands, shopping? 0 0  Preparing Food and eating ? N   Using the Toilet? N   In the past six months, have you accidently leaked urine? N   Do you have problems with loss of bowel control? N   Managing your Medications? Y   Comment daughter in-law helps   Managing your Finances? Y   Comment son helps her   Housekeeping or managing your Housekeeping? N     Patient Care Team: CoxFritzi Mandes, MD as PCP - General (Family Medicine)  Indicate any recent Medical Services you may have received from other than [facility] providers in the past year (date may be approximate).     Assessment:   This is a routine wellness examination for [place].  Hearing/Vision  screen No results found.  Dietary issues and exercise activities discussed:     Goals Addressed   None   Depression Screen    02/10/2023    3:37 PM 12/31/2022   10:59 AM 09/02/2022    3:18 PM 08/16/2022   12:10 PM 03/05/2022    2:07 PM 11/27/2021    2:20 PM 08/27/2021    1:50 PM  PHQ 2/9 Scores  PHQ - 2 Score 1 2 0 2  1 2 0  PHQ- 9 Score 4 5 0 4 1 14 3     Fall Risk    12/31/2022   10:59 AM 09/02/2022    3:18 PM 08/16/2022   12:13 PM 03/21/2021    1:31 PM 12/15/2019   11:34 AM  Fall Risk   Falls in the past year? 1 1 0 0 0  Number falls in past yr: 0 0 0 0 0  Injury with Fall? 0 0 0 0 0  Risk for fall due to : No Fall Risks No Fall Risks No Fall Risks;Other (Comment) No Fall Risks   Follow up Falls evaluation completed;Falls prevention discussed Falls evaluation completed Follow up appointment Education provided Falls evaluation completed    MEDICARE RISK AT HOME:   TIMED UP AND GO:  Was the test performed? Yes  Length of time to ambulate 10 feet: 9 sec Gait steady and fast without use of assistive device    Cognitive Function:        02/10/2023    3:41 PM  6CIT Screen  What Year? 0 points  What month? 0 points  What time? 0 points  Count back from 20 0 points  Months in reverse 0 points  Repeat phrase 0 points  Total Score 0 points    Immunizations Immunization History  Administered Date(s) Administered   Pneumococcal Polysaccharide-23 03/04/2017   Tdap 10/04/2016    TDAP status: Up to date  Flu Vaccine status: Due, Education has been provided regarding the importance of this vaccine. Advised may receive this vaccine at local pharmacy or Health Dept. Aware to provide a copy of the vaccination record if obtained from local pharmacy or Health Dept. Verbalized acceptance and understanding.  Pneumococcal vaccine status: Up to date  Covid-19 vaccine status: Completed vaccines Had 2 vaccines. Does not want more.   Qualifies for Shingles Vaccine? Yes   Zostavax  completed No   Shingrix Completed?: No.    Education has been provided regarding the importance of this vaccine. Patient has been advised to call insurance company to determine out of pocket expense if they have not yet received this vaccine. Advised may also receive vaccine at local pharmacy or Health Dept. Verbalized acceptance and understanding.  Screening Tests Health Maintenance  Topic Date Due   COVID-19 Vaccine (1) Never done   Hepatitis C Screening  Never done   Zoster Vaccines- Shingrix (1 of 2) Never done   Pneumonia Vaccine 26+ Years old (2 of 2 - PCV) 03/04/2018   INFLUENZA VACCINE  01/30/2023   Medicare Annual Wellness (AWV)  02/10/2024   DTaP/Tdap/Td (2 - Td or Tdap) 10/05/2026   DEXA SCAN  Completed   HPV VACCINES  Aged Out   Colonoscopy  Discontinued    Health Maintenance  Health Maintenance Due  Topic Date Due   COVID-19 Vaccine (1) Never done   Hepatitis C Screening  Never done   Zoster Vaccines- Shingrix (1 of 2) Never done   Pneumonia Vaccine 34+ Years old (2 of 2 - PCV) 03/04/2018   INFLUENZA VACCINE  01/30/2023    Colorectal cancer screening: No longer required.   Mammogram status: No longer required due to age.  Bone Density status: Completed 2016. Results reflect: Bone density results: OSTEOPENIA. Repeat every 2-3 years. But patient is not too keen on it. Will offer at next visit. Advised to take calcium 1000-1200 mg with vitamin D3 1000-2000 international units  Lung Cancer Screening: (Low Dose CT Chest recommended if Age 41-80  years, 20 pack-year currently smoking OR have quit w/in 15years.) does not qualify.   Lung Cancer Screening Referral:   Additional Screening:  Hepatitis C Screening: does not qualify;  Vision Screening: Recommended annual ophthalmology exams for early detection of glaucoma and other disorders of the eye. Is the patient up to date with their annual eye exam?  Yes  Who is the provider or what is the name of the office in  which the patient attends annual eye exams? [facility]  If pt is not established with a provider, would they like to be referred to a provider to establish care?  Not applicable .   Dental Screening: Recommended annual dental exams for proper oral hygiene    Community Resource Referral / Chronic Care Management: CRR required this visit?  No   CCM required this visit?  No     Plan:     I have personally reviewed and noted the following in the patient's chart:   Medical and social history Use of alcohol, tobacco or illicit drugs  Current medications and supplements including opioid prescriptions. Patient is not currently taking opioid prescriptions. Functional ability and status Nutritional status Physical activity Advanced directives List of other physicians Hospitalizations, surgeries, and ER visits in previous 12 months Vitals Screenings to include cognitive, depression, and falls Referrals and appointments  In addition, I have reviewed and discussed with patient certain preventive protocols, quality metrics, and best practice recommendations. A written personalized care plan for preventive services as well as general preventive health recommendations were provided to patient.     Eugenie Norrie, CMA   02/10/2023   After Visit Summary: printed

## 2023-02-17 ENCOUNTER — Other Ambulatory Visit: Payer: Self-pay | Admitting: Family Medicine

## 2023-02-17 DIAGNOSIS — F324 Major depressive disorder, single episode, in partial remission: Secondary | ICD-10-CM

## 2023-02-25 ENCOUNTER — Other Ambulatory Visit: Payer: Self-pay

## 2023-02-25 ENCOUNTER — Other Ambulatory Visit: Payer: Self-pay | Admitting: Family Medicine

## 2023-02-25 DIAGNOSIS — F4321 Adjustment disorder with depressed mood: Secondary | ICD-10-CM

## 2023-02-25 MED ORDER — ALPRAZOLAM 0.5 MG PO TABS: 0.5000 mg | ORAL_TABLET | Freq: Two times a day (BID) | ORAL | 2 refills | Status: DC | PRN

## 2023-02-25 NOTE — Telephone Encounter (Signed)
Patient called requesting refill of Xanax be sent to [facility].  Last refilled 01/27/23 #60

## 2023-02-26 ENCOUNTER — Other Ambulatory Visit: Payer: Self-pay

## 2023-02-26 MED ORDER — ARIPIPRAZOLE 2 MG PO TABS: 2.0000 mg | ORAL_TABLET | Freq: Every evening | ORAL | 0 refills | Status: DC | PRN

## 2023-03-01 ENCOUNTER — Other Ambulatory Visit: Payer: Self-pay | Admitting: Family Medicine

## 2023-03-01 DIAGNOSIS — E782 Mixed hyperlipidemia: Secondary | ICD-10-CM

## 2023-03-04 ENCOUNTER — Other Ambulatory Visit: Payer: Self-pay

## 2023-03-21 ENCOUNTER — Other Ambulatory Visit: Payer: Self-pay | Admitting: Family Medicine

## 2023-03-21 DIAGNOSIS — I10 Essential (primary) hypertension: Secondary | ICD-10-CM

## 2023-04-14 NOTE — Progress Notes (Unsigned)
Subjective:  Patient ID: Cheryl Myers, female    DOB: [birth year]  Age: 77 y.o. MRN: [id number]  Chief Complaint  Patient presents with   Medical Management of Chronic Issues    HPI Hypertension:  Valsartan-HCT 160 mg daily. She has been having episodes of feeling light-headed when standing.  She has been careful to stand up slowly.  Had spinning.  Patient had not taken bp medicine yet.    Hyperipidemia:  Crestor 5 mg daily.     GAD:  paroxetine 20 mg daily, abilify 2 mg daily, and xanax 0.5 mg twice daily.       04/15/2023   10:33 AM 02/10/2023    3:37 PM 12/31/2022   10:59 AM 09/02/2022    3:18 PM 08/16/2022   12:10 PM  Depression screen PHQ 2/9  Decreased Interest 0 0 1 0 1  Down, Depressed, Hopeless 0 1 1 0 1  PHQ - 2 Score 0 1 2 0 2  Altered sleeping  1 2 0 1  Tired, decreased energy  1 0 0 1  Change in appetite  0 1 0 0  Feeling bad or failure about yourself   0 0 0 0  Trouble concentrating  1 0    Moving slowly or fidgety/restless  0 0 0 0  Suicidal thoughts  0 0 0 0  PHQ-9 Score  4 5 0 4  Difficult doing work/chores Not difficult at all Not difficult at all Not difficult at all Not difficult at all Somewhat difficult        04/15/2023   10:32 AM  Fall Risk   Falls in the past year? 0  Number falls in past yr: 0  Injury with Fall? 0  Risk for fall due to : History of fall(s)  Follow up Falls evaluation completed;Follow up appointment    Patient Care Team: Renne Crigler, FNP as PCP - General (Family Medicine)   Review of Systems  Constitutional:  Negative for chills, fatigue and fever.  HENT:  Negative for congestion, ear pain, rhinorrhea and sore throat.   Respiratory:  Negative for cough and shortness of breath.   Cardiovascular:  Negative for chest pain.  Gastrointestinal:  Negative for abdominal pain, constipation, diarrhea, nausea and vomiting.  Genitourinary:  Negative for dysuria and urgency.  Musculoskeletal:  Negative for back pain and myalgias.   Neurological:  Negative for dizziness, weakness, light-headedness and headaches.  Psychiatric/Behavioral:  Negative for dysphoric mood. The patient is not nervous/anxious.     Current Outpatient Medications on File Prior to Visit  Medication Sig Dispense Refill   rosuvastatin (CRESTOR) 5 MG tablet Take 1 tablet (5 mg total) by mouth daily. 90 tablet 1   No current facility-administered medications on file prior to visit.   Past Medical History:  Diagnosis Date   Anxiety    Cancer (HCC)    Basal and squamous cell skin cancer   Hyperlipidemia    Hypertension    Osteopenia 08/2014   T score -1.9 FRAX 11%/1.8%   Past Surgical History:  Procedure Laterality Date   Basal and squamous cell skin cancers excised      OVARIAN CYST REMOVAL      Family History  Problem Relation Age of Onset   Diabetes Mother    Alzheimer's disease Mother    Melanoma Brother    Diabetes Maternal Aunt    Melanoma Maternal Uncle    Diabetes Maternal Uncle    Heart disease Maternal Uncle  Melanoma Maternal Uncle    Heart disease Maternal Uncle    Social History   Socioeconomic History   Marital status: Married    Spouse name: Not on file   Number of children: Not on file   Years of education: Not on file   Highest education level: Not on file  Occupational History   Not on file  Tobacco Use   Smoking status: Never   Smokeless tobacco: Never  Vaping Use   Vaping status: Never Used  Substance and Sexual Activity   Alcohol use: Yes    Alcohol/week: 1.0 standard drink of alcohol    Types: 1 Glasses of wine per week    Comment: socially   Drug use: No   Sexual activity: Not Currently    Birth control/protection: Post-menopausal    Comment: 1st intercourse 50 yo--1 partner  Other Topics Concern   Not on file  Social History Narrative   Not on file   Social Determinants of Health   Financial Resource Strain: Low Risk  (02/10/2023)   Overall Financial Resource Strain (CARDIA)     Difficulty of Paying Living Expenses: Not hard at all  Food Insecurity: No Food Insecurity (02/10/2023)   Hunger Vital Sign    Worried About Running Out of Food in the Last Year: Never true    Ran Out of Food in the Last Year: Never true  Transportation Needs: No Transportation Needs (02/10/2023)   PRAPARE - Administrator, Civil Service (Medical): No    Lack of Transportation (Non-Medical): No  Physical Activity: Insufficiently Active (02/10/2023)   Exercise Vital Sign    Days of Exercise per Week: 3 days    Minutes of Exercise per Session: 20 min  Stress: No Stress Concern Present (02/10/2023)   [employer] of Occupational Health - Occupational Stress Questionnaire    Feeling of Stress : Not at all  Social Connections: Moderately Isolated (02/10/2023)   Social Connection and Isolation Panel [NHANES]    Frequency of Communication with Friends and Family: More than three times a week    Frequency of Social Gatherings with Friends and Family: More than three times a week    Attends Religious Services: More than 4 times per year    Active Member of [employer] or Organizations: No    Attends Banker Meetings: Never    Marital Status: Widowed    Objective:  BP (!) 150/80   Pulse 64   Temp (!) 95.8 F (35.4 C)   Resp 16   Ht 5\' 7"  (1.702 m)   Wt 147 lb 9.6 oz (67 kg)   BMI 23.12 kg/m      04/15/2023   10:28 AM 02/10/2023    3:30 PM 12/31/2022   10:53 AM  BP/Weight  Systolic BP 150 120 144  Diastolic BP 80 70 72  Wt. (Lbs) 147.6 150 149  BMI 23.12 kg/m2 23.49 kg/m2 23.34 kg/m2    Physical Exam Vitals reviewed.  Constitutional:      Appearance: Normal appearance. She is normal weight.  Neck:     Vascular: No carotid bruit.  Cardiovascular:     Rate and Rhythm: Normal rate and regular rhythm.     Heart sounds: Normal heart sounds.  Pulmonary:     Effort: Pulmonary effort is normal. No respiratory distress.     Breath sounds: Normal breath sounds.   Abdominal:     General: Abdomen is flat. Bowel sounds are normal.  Palpations: Abdomen is soft.     Tenderness: There is no abdominal tenderness.  Neurological:     Mental Status: She is alert and oriented to person, place, and time.  Psychiatric:        Mood and Affect: Mood normal.        Behavior: Behavior normal.     Diabetic Foot Exam - Simple   No data filed      Lab Results  Component Value Date   WBC 3.8 04/15/2023   HGB 11.9 04/15/2023   HCT 37.6 04/15/2023   PLT 248 04/15/2023   GLUCOSE 101 (H) 04/15/2023   CHOL 193 04/15/2023   TRIG 86 04/15/2023   HDL 76 04/15/2023   LDLCALC 102 (H) 04/15/2023   ALT 14 04/15/2023   AST 20 04/15/2023   NA 142 04/15/2023   K 5.1 04/15/2023   CL 104 04/15/2023   CREATININE 0.96 04/15/2023   BUN 15 04/15/2023   CO2 24 04/15/2023   TSH 2.990 08/16/2022      Assessment & Plan:    HTN (hypertension), benign Assessment & Plan: Increase valsartan 320 mg once daily.  Change positions slowly. Continue to work on eating a healthy diet and exercise.  Labs drawn today.      Orders: -     CBC with Differential/Platelet -     Comprehensive metabolic panel -     Valsartan; Take 1 tablet (320 mg total) by mouth daily.  Dispense: 90 tablet; Refill: 0  Mixed hyperlipidemia Assessment & Plan: Well controlled.  No changes to medicines. Rosuvastatin 5 mg daily Continue to work on eating a healthy diet and exercise.  Labs drawn today.    Orders: -     Lipid panel  GAD (generalized anxiety disorder) Assessment & Plan: The current medical regimen is effective;  continue present plan and medications.    Depression, major, single episode, in partial remission (HCC) Assessment & Plan: Continue Xanax 0.5 mg BD AS NEEDED Continue Abilify  5 mg Continue Paxil to 20 mg     Orders: -     PARoxetine HCl; Take 1 tablet (20 mg total) by mouth daily.  Dispense: 90 tablet; Refill: 1  Need for hepatitis C screening test -      HCV Ab w Reflex to Quant PCR  Grief Assessment & Plan: Continue with Xanax 0.5 mg twice a day prn.  Orders: -     ALPRAZolam; Take 1 tablet (0.5 mg total) by mouth 2 (two) times daily as needed. for anxiety  Dispense: 60 tablet; Refill: 3  Encounter for immunization -     Flu Vaccine Trivalent High Dose (Fluad) -     Pneumococcal conjugate vaccine 20-valent  Other orders -     ARIPiprazole; Take 1 tablet (2 mg total) by mouth at bedtime as needed.  Dispense: 90 tablet; Refill: 1 -     Interpretation:     Meds ordered this encounter  Medications   ALPRAZolam (XANAX) 0.5 MG tablet    Sig: Take 1 tablet (0.5 mg total) by mouth 2 (two) times daily as needed. for anxiety    Dispense:  60 tablet    Refill:  3   ARIPiprazole (ABILIFY) 2 MG tablet    Sig: Take 1 tablet (2 mg total) by mouth at bedtime as needed.    Dispense:  90 tablet    Refill:  1   PARoxetine (PAXIL) 20 MG tablet    Sig: Take 1 tablet (20 mg  total) by mouth daily.    Dispense:  90 tablet    Refill:  1   valsartan (DIOVAN) 320 MG tablet    Sig: Take 1 tablet (320 mg total) by mouth daily.    Dispense:  90 tablet    Refill:  0    Orders Placed This Encounter  Procedures   Flu Vaccine Trivalent High Dose (Fluad)   Pneumococcal conjugate vaccine 20-valent   CBC with Differential/Platelet   Comprehensive metabolic panel   HCV Ab w Reflex to Quant PCR   Lipid panel   Interpretation:     Follow-up: Return in about 4 weeks (around 05/13/2023) for chronic follow up, Dina hypertension.   I,Katherina A Bramblett,acting as a scribe for Blane Ohara, MD.,have documented all relevant documentation on the behalf of Blane Ohara, MD,as directed by  Blane Ohara, MD while in the presence of Blane Ohara, MD.   Clayborn Bigness I Leal-Borjas,acting as a scribe for Blane Ohara, MD.,have documented all relevant documentation on the behalf of Blane Ohara, MD,as directed by  Blane Ohara, MD while in the presence of Blane Ohara, MD.      An After Visit Summary was printed and given to the patient.  I attest that I have reviewed this visit and agree with the plan scribed by my staff.   Blane Ohara, MD [facility] [phone]

## 2023-04-14 NOTE — Assessment & Plan Note (Addendum)
Increase valsartan 320 mg once daily.  Change positions slowly. Continue to work on eating a healthy diet and exercise.  Labs drawn today.

## 2023-04-14 NOTE — Assessment & Plan Note (Signed)
The current medical regimen is effective;  continue present plan and medications.

## 2023-04-14 NOTE — Assessment & Plan Note (Signed)
Continue Xanax 0.5 mg BD AS NEEDED Continue Abilify  5 mg Continue Paxil to 20 mg

## 2023-04-14 NOTE — Assessment & Plan Note (Signed)
Well controlled.  No changes to medicines. Rosuvastatin 5 mg daily. Continue to work on eating a healthy diet and exercise.  Labs drawn today.

## 2023-04-15 ENCOUNTER — Encounter: Payer: Self-pay | Admitting: Family Medicine

## 2023-04-15 ENCOUNTER — Ambulatory Visit (INDEPENDENT_AMBULATORY_CARE_PROVIDER_SITE_OTHER): Payer: Medicare Other | Admitting: Family Medicine

## 2023-04-15 VITALS — BP 150/80 | HR 64 | Temp 95.8°F | Resp 16 | Ht 67.0 in | Wt 147.6 lb

## 2023-04-15 DIAGNOSIS — F324 Major depressive disorder, single episode, in partial remission: Secondary | ICD-10-CM | POA: Diagnosis not present

## 2023-04-15 DIAGNOSIS — F411 Generalized anxiety disorder: Secondary | ICD-10-CM | POA: Diagnosis not present

## 2023-04-15 DIAGNOSIS — I10 Essential (primary) hypertension: Secondary | ICD-10-CM

## 2023-04-15 DIAGNOSIS — F4321 Adjustment disorder with depressed mood: Secondary | ICD-10-CM

## 2023-04-15 DIAGNOSIS — Z1159 Encounter for screening for other viral diseases: Secondary | ICD-10-CM

## 2023-04-15 DIAGNOSIS — E782 Mixed hyperlipidemia: Secondary | ICD-10-CM

## 2023-04-15 DIAGNOSIS — Z23 Encounter for immunization: Secondary | ICD-10-CM

## 2023-04-15 MED ORDER — ALPRAZOLAM 0.5 MG PO TABS
0.5000 mg | ORAL_TABLET | Freq: Two times a day (BID) | ORAL | 3 refills | Status: DC | PRN
Start: 2023-04-15 — End: 2023-09-19

## 2023-04-15 MED ORDER — VALSARTAN 320 MG PO TABS
320.0000 mg | ORAL_TABLET | Freq: Every day | ORAL | 0 refills | Status: DC
Start: 2023-04-15 — End: 2023-07-24

## 2023-04-15 MED ORDER — ARIPIPRAZOLE 2 MG PO TABS: 2.0000 mg | ORAL_TABLET | Freq: Every evening | ORAL | 1 refills | Status: DC | PRN

## 2023-04-15 MED ORDER — PAROXETINE HCL 20 MG PO TABS
20.0000 mg | ORAL_TABLET | Freq: Every day | ORAL | 1 refills | Status: DC
Start: 2023-04-15 — End: 2023-11-17

## 2023-04-15 NOTE — Patient Instructions (Addendum)
Increase valsartan 320 mg once daily.   Recommend shingles vaccine series.

## 2023-04-16 LAB — CBC WITH DIFFERENTIAL/PLATELET
Basophils Absolute: 0 10*3/uL (ref 0.0–0.2)
Basos: 1 %
EOS (ABSOLUTE): 0.1 10*3/uL (ref 0.0–0.4)
Eos: 3 %
Hematocrit: 37.6 % (ref 34.0–46.6)
Hemoglobin: 11.9 g/dL (ref 11.1–15.9)
Immature Grans (Abs): 0 10*3/uL (ref 0.0–0.1)
Immature Granulocytes: 0 %
Lymphocytes Absolute: 1.5 10*3/uL (ref 0.7–3.1)
Lymphs: 39 %
MCH: 29.5 pg (ref 26.6–33.0)
MCHC: 31.6 g/dL (ref 31.5–35.7)
MCV: 93 fL (ref 79–97)
Monocytes Absolute: 0.4 10*3/uL (ref 0.1–0.9)
Monocytes: 11 %
Neutrophils Absolute: 1.8 10*3/uL (ref 1.4–7.0)
Neutrophils: 46 %
Platelets: 248 10*3/uL (ref 150–450)
RBC: 4.04 x10E6/uL (ref 3.77–5.28)
RDW: 12.4 % (ref 11.7–15.4)
WBC: 3.8 10*3/uL (ref 3.4–10.8)

## 2023-04-16 LAB — LIPID PANEL
Chol/HDL Ratio: 2.5 {ratio} (ref 0.0–4.4)
Cholesterol, Total: 193 mg/dL (ref 100–199)
HDL: 76 mg/dL (ref >39)
LDL Chol Calc (NIH): 102 mg/dL — ABNORMAL HIGH (ref 0–99)
Triglycerides: 86 mg/dL (ref 0–149)
VLDL Cholesterol Cal: 15 mg/dL (ref 5–40)

## 2023-04-16 LAB — COMPREHENSIVE METABOLIC PANEL
ALT: 14 [IU]/L (ref 0–32)
AST: 20 [IU]/L (ref 0–40)
Albumin: 4.6 g/dL (ref 3.8–4.8)
Alkaline Phosphatase: 86 [IU]/L (ref 44–121)
BUN/Creatinine Ratio: 16 (ref 12–28)
BUN: 15 mg/dL (ref 8–27)
Bilirubin Total: 1 mg/dL (ref 0.0–1.2)
CO2: 24 mmol/L (ref 20–29)
Calcium: 10.1 mg/dL (ref 8.7–10.3)
Chloride: 104 mmol/L (ref 96–106)
Creatinine, Ser: 0.96 mg/dL (ref 0.57–1.00)
Globulin, Total: 2.4 g/dL (ref 1.5–4.5)
Glucose: 101 mg/dL — ABNORMAL HIGH (ref 70–99)
Potassium: 5.1 mmol/L (ref 3.5–5.2)
Sodium: 142 mmol/L (ref 134–144)
Total Protein: 7 g/dL (ref 6.0–8.5)
eGFR: 61 mL/min/{1.73_m2} (ref >59)

## 2023-04-16 LAB — HCV AB W REFLEX TO QUANT PCR: HCV Ab: NONREACTIVE

## 2023-04-16 LAB — HCV INTERPRETATION

## 2023-04-19 NOTE — Assessment & Plan Note (Signed)
Continue with Xanax 0.5 mg twice a day prn.

## 2023-05-13 ENCOUNTER — Ambulatory Visit (INDEPENDENT_AMBULATORY_CARE_PROVIDER_SITE_OTHER): Payer: Medicare Other | Admitting: Family Medicine

## 2023-05-13 ENCOUNTER — Encounter: Payer: Self-pay | Admitting: Family Medicine

## 2023-05-13 VITALS — BP 134/78 | HR 68 | Temp 97.9°F | Resp 16 | Ht 67.0 in | Wt 153.0 lb

## 2023-05-13 DIAGNOSIS — F411 Generalized anxiety disorder: Secondary | ICD-10-CM | POA: Diagnosis not present

## 2023-05-13 DIAGNOSIS — I10 Essential (primary) hypertension: Secondary | ICD-10-CM

## 2023-05-13 NOTE — Assessment & Plan Note (Signed)
Well controlled Increased stress due to family health issues. Currently on Paxil, Abilify, and Xanax as needed. No desire for counseling at this time. -Continue current medication regimen. -Encourage continued use of coping strategies such as socializing and home projects.

## 2023-05-13 NOTE — Assessment & Plan Note (Signed)
Well controlled on Valsartan-Hydrochlorothiazide 320mg  daily. No symptoms of dizziness or headaches. -Continue current medication regimen. FU in 3 months

## 2023-05-13 NOTE — Progress Notes (Signed)
Subjective:  Patient ID: Cheryl Myers, female    DOB: 17-Sep-1945  Age: 77 y.o. MRN: [id number]  Chief Complaint  Patient presents with   Follow-up    Blood Pressure    HPI The patient, with a history of hypertension and depression, presents for a routine follow-up. The patient's blood pressure is well controlled on Valsartan and Hydrochlorothiazide, recently increased to 320mg  daily. The patient reports no adverse effects from the medication and home readings are within the normal range.  The patient's depression and anxiety have been exacerbated due to family health issues. The patient's son has liver failure and her daughter has MS with declining kidney function. The patient is currently on Paxil, Abilify, and Xanax, which seem to be managing her symptoms adequately. The patient has a strong support system and engages in activities such as home decoration and social outings to manage her mental health.     05/13/2023   11:00 AM 04/15/2023   10:33 AM 02/10/2023    3:37 PM 12/31/2022   10:59 AM 09/02/2022    3:18 PM  Depression screen PHQ 2/9  Decreased Interest 1 0 0 1 0  Down, Depressed, Hopeless 1 0 1 1 0  PHQ - 2 Score 2 0 1 2 0  Altered sleeping 1  1 2  0  Tired, decreased energy 0  1 0 0  Change in appetite 1  0 1 0  Feeling bad or failure about yourself  1  0 0 0  Trouble concentrating 0  1 0   Moving slowly or fidgety/restless 0  0 0 0  Suicidal thoughts 0  0 0 0  PHQ-9 Score 5  4 5  0  Difficult doing work/chores Very difficult Not difficult at all Not difficult at all Not difficult at all Not difficult at all        04/15/2023   10:32 AM  Fall Risk   Falls in the past year? 0  Number falls in past yr: 0  Injury with Fall? 0  Risk for fall due to : History of fall(s)  Follow up Falls evaluation completed;Follow up appointment    Patient Care Team: Renne Crigler, FNP as PCP - General (Family Medicine)   Review of Systems  Constitutional:  Negative for chills,  diaphoresis and fever.  HENT:  Negative for congestion, ear pain and sinus pain.   Respiratory:  Negative for cough and shortness of breath.   Cardiovascular:  Negative for chest pain.  Gastrointestinal:  Negative for abdominal pain, constipation, nausea and vomiting.  Genitourinary:  Negative for dysuria, frequency and urgency.  Musculoskeletal:  Negative for arthralgias and myalgias.  Skin:  Negative for rash.  Neurological:  Positive for dizziness (rarely). Negative for weakness and headaches.  Psychiatric/Behavioral:  Negative for dysphoric mood. The patient is nervous/anxious.     Current Outpatient Medications on File Prior to Visit  Medication Sig Dispense Refill   ALPRAZolam (XANAX) 0.5 MG tablet Take 1 tablet (0.5 mg total) by mouth 2 (two) times daily as needed. for anxiety 60 tablet 3   ARIPiprazole (ABILIFY) 2 MG tablet Take 1 tablet (2 mg total) by mouth at bedtime as needed. 90 tablet 1   PARoxetine (PAXIL) 20 MG tablet Take 1 tablet (20 mg total) by mouth daily. 90 tablet 1   rosuvastatin (CRESTOR) 5 MG tablet Take 1 tablet (5 mg total) by mouth daily. 90 tablet 1   valsartan (DIOVAN) 320 MG tablet Take 1 tablet (320 mg total)  by mouth daily. 90 tablet 0   No current facility-administered medications on file prior to visit.   Past Medical History:  Diagnosis Date   Anxiety    Cancer (HCC)    Basal and squamous cell skin cancer   Hyperlipidemia    Hypertension    Osteopenia 08/2014   T score -1.9 FRAX 11%/1.8%   Past Surgical History:  Procedure Laterality Date   Basal and squamous cell skin cancers excised      OVARIAN CYST REMOVAL      Family History  Problem Relation Age of Onset   Diabetes Mother    Alzheimer's disease Mother    Melanoma Brother    Diabetes Maternal Aunt    Melanoma Maternal Uncle    Diabetes Maternal Uncle    Heart disease Maternal Uncle    Melanoma Maternal Uncle    Heart disease Maternal Uncle    Social History   Socioeconomic  History   Marital status: Married    Spouse name: Not on file   Number of children: Not on file   Years of education: Not on file   Highest education level: Not on file  Occupational History   Not on file  Tobacco Use   Smoking status: Never   Smokeless tobacco: Never  Vaping Use   Vaping status: Never Used  Substance and Sexual Activity   Alcohol use: Yes    Alcohol/week: 1.0 standard drink of alcohol    Types: 1 Glasses of wine per week    Comment: socially   Drug use: No   Sexual activity: Not Currently    Birth control/protection: Post-menopausal    Comment: 1st intercourse 54 yo--1 partner  Other Topics Concern   Not on file  Social History Narrative   Not on file   Social Determinants of Health   Financial Resource Strain: Low Risk  (02/10/2023)   Overall Financial Resource Strain (CARDIA)    Difficulty of Paying Living Expenses: Not hard at all  Food Insecurity: No Food Insecurity (02/10/2023)   Hunger Vital Sign    Worried About Running Out of Food in the Last Year: Never true    Ran Out of Food in the Last Year: Never true  Transportation Needs: No Transportation Needs (02/10/2023)   PRAPARE - Administrator, Civil Service (Medical): No    Lack of Transportation (Non-Medical): No  Physical Activity: Insufficiently Active (02/10/2023)   Exercise Vital Sign    Days of Exercise per Week: 3 days    Minutes of Exercise per Session: 20 min  Stress: No Stress Concern Present (02/10/2023)   [employer] of Occupational Health - Occupational Stress Questionnaire    Feeling of Stress : Not at all  Social Connections: Moderately Isolated (02/10/2023)   Social Connection and Isolation Panel [NHANES]    Frequency of Communication with Friends and Family: More than three times a week    Frequency of Social Gatherings with Friends and Family: More than three times a week    Attends Religious Services: More than 4 times per year    Active Member of [employer] or  Organizations: No    Attends Banker Meetings: Never    Marital Status: Widowed    Objective:  BP 134/78   Pulse 68   Temp 97.9 F (36.6 C)   Resp 16   Ht 5\' 7"  (1.702 m)   Wt 153 lb (69.4 kg)   SpO2 94%   BMI 23.96 kg/m  05/13/2023   10:53 AM 04/15/2023   10:28 AM 02/10/2023    3:30 PM  BP/Weight  Systolic BP 134 150 120  Diastolic BP 78 80 70  Wt. (Lbs) 153 147.6 150  BMI 23.96 kg/m2 23.12 kg/m2 23.49 kg/m2    Physical Exam Constitutional:      General: She is not in acute distress.    Appearance: Normal appearance. She is not ill-appearing.  Neck:     Vascular: No carotid bruit.  Cardiovascular:     Rate and Rhythm: Normal rate and regular rhythm.     Heart sounds: Normal heart sounds.  Pulmonary:     Effort: Pulmonary effort is normal.     Breath sounds: Normal breath sounds. No wheezing.  Abdominal:     General: Bowel sounds are normal.     Palpations: Abdomen is soft.     Tenderness: There is no abdominal tenderness.  Neurological:     Mental Status: She is alert. Mental status is at baseline.  Psychiatric:        Mood and Affect: Mood normal.        Behavior: Behavior normal.       Lab Results  Component Value Date   WBC 3.8 04/15/2023   HGB 11.9 04/15/2023   HCT 37.6 04/15/2023   PLT 248 04/15/2023   GLUCOSE 101 (H) 04/15/2023   CHOL 193 04/15/2023   TRIG 86 04/15/2023   HDL 76 04/15/2023   LDLCALC 102 (H) 04/15/2023   ALT 14 04/15/2023   AST 20 04/15/2023   NA 142 04/15/2023   K 5.1 04/15/2023   CL 104 04/15/2023   CREATININE 0.96 04/15/2023   BUN 15 04/15/2023   CO2 24 04/15/2023   TSH 2.990 08/16/2022      Assessment & Plan:    HTN (hypertension), benign Assessment & Plan: Well controlled on Valsartan-Hydrochlorothiazide 320mg  daily. No symptoms of dizziness or headaches. -Continue current medication regimen. FU in 3 months   GAD (generalized anxiety disorder) Assessment & Plan: Well  controlled Increased stress due to family health issues. Currently on Paxil, Abilify, and Xanax as needed. No desire for counseling at this time. -Continue current medication regimen. -Encourage continued use of coping strategies such as socializing and home projects.      Follow-up: Return in about 3 months (around 08/13/2023) for chronic.   Madelynn Done Smith,acting as a Neurosurgeon for Renne Crigler, FNP.,have documented all relevant documentation on the behalf of Renne Crigler, FNP,as directed by  Renne Crigler, FNP while in the presence of Renne Crigler, FNP.   An After Visit Summary was printed and given to the patient.  Total time spent on today's visit was greater than 30 minutes, including both face-to-face time and nonface-to-face time personally spent on review of chart (labs and imaging), discussing labs and goals, discussing further work-up, treatment options, referrals to specialist if needed, reviewing outside records of pertinent, answering patient's questions, and coordinating care.   Lajuana Matte, FNP [facility] [phone]

## 2023-07-24 ENCOUNTER — Other Ambulatory Visit: Payer: Self-pay | Admitting: Family Medicine

## 2023-07-24 DIAGNOSIS — I10 Essential (primary) hypertension: Secondary | ICD-10-CM

## 2023-08-18 ENCOUNTER — Encounter: Payer: Self-pay | Admitting: Family Medicine

## 2023-08-18 ENCOUNTER — Ambulatory Visit (INDEPENDENT_AMBULATORY_CARE_PROVIDER_SITE_OTHER): Payer: Medicare Other | Admitting: Family Medicine

## 2023-08-18 VITALS — BP 160/80 | HR 68 | Temp 97.3°F | Resp 14 | Ht 67.0 in | Wt 151.0 lb

## 2023-08-18 DIAGNOSIS — I10 Essential (primary) hypertension: Secondary | ICD-10-CM | POA: Diagnosis not present

## 2023-08-18 DIAGNOSIS — F411 Generalized anxiety disorder: Secondary | ICD-10-CM | POA: Diagnosis not present

## 2023-08-18 DIAGNOSIS — E782 Mixed hyperlipidemia: Secondary | ICD-10-CM | POA: Diagnosis not present

## 2023-08-18 DIAGNOSIS — R4182 Altered mental status, unspecified: Secondary | ICD-10-CM | POA: Insufficient documentation

## 2023-08-18 DIAGNOSIS — F324 Major depressive disorder, single episode, in partial remission: Secondary | ICD-10-CM

## 2023-08-18 DIAGNOSIS — R739 Hyperglycemia, unspecified: Secondary | ICD-10-CM

## 2023-08-18 LAB — POCT URINALYSIS DIP (CLINITEK)
Bilirubin, UA: NEGATIVE
Blood, UA: NEGATIVE
Glucose, UA: NEGATIVE mg/dL
Ketones, POC UA: NEGATIVE mg/dL
Leukocytes, UA: NEGATIVE
Nitrite, UA: NEGATIVE
POC PROTEIN,UA: NEGATIVE
Spec Grav, UA: 1.02 (ref 1.010–1.025)
Urobilinogen, UA: 0.2 U/dL
pH, UA: 5.5 (ref 5.0–8.0)

## 2023-08-18 NOTE — Assessment & Plan Note (Addendum)
Not well controlled    08/18/2023   10:20 AM 05/13/2023   11:01 AM 04/15/2023   10:33 AM 02/10/2023    4:26 PM  GAD 7 : Generalized Anxiety Score  Nervous, Anxious, on Edge 3 1 0 1  Control/stop worrying 3 2 0 1  Worry too much - different things 3 2 0 1  Trouble relaxing 2 2 0 1  Restless 2 1 0 0  Easily annoyed or irritable 2 0 0 0  Afraid - awful might happen 2 1 0 0  Total GAD 7 Score 17 9 0 4  Anxiety Difficulty Somewhat difficult  Not difficult at all Not difficult at all   Patient reports increased stress and difficulty sleeping, possibly related to family stressors. Patient is on Xanax, Paxil, and Abilify. Discussed the importance of consistent medication use and potential side effects of current medications. -Recommend taking half a Xanax during the day and a whole one at night for sleep. -Shift Abilify to morning dose to reduce insomnia. -Consider adding magnesium and L-lysine supplements for anxiety.

## 2023-08-18 NOTE — Assessment & Plan Note (Signed)
 Labs drawn, Await labs/testing for assessment and recommendations

## 2023-08-18 NOTE — Assessment & Plan Note (Addendum)
Stable    08/18/2023   10:19 AM 05/13/2023   11:00 AM 04/15/2023   10:33 AM  PHQ9 SCORE ONLY  PHQ-9 Total Score 7 5 0  -Continue Paxil 20 mg once daily -Refused counseling due to adequate support system

## 2023-08-18 NOTE — Progress Notes (Signed)
Subjective:  Patient ID: Cheryl Myers, female    DOB: Jan 04, 1946  Age: 78 y.o. MRN: [id number]  Chief Complaint  Patient presents with   Medical Management of Chronic Issues   Discussed the use of AI scribe software for clinical note transcription with the patient, who gave verbal consent to proceed.   HPI Cheryl Myers is a 78 year old female with anxiety and hypertension who presents with difficulty sleeping and medication management concerns. She is accompanied by her daughter-in-law.  She is experiencing difficulty sleeping, which she attributes to stress and an inability to 'turn her brain off.' Her sleep issues have been exacerbated by recent family stressors, including concerns about her aunt's health and her own medication management. She takes Xanax twice daily as needed for anxiety, but sometimes takes an additional dose during the day if she feels particularly anxious. She is concerned about running out of her medication before it can be refilled.  Her current medications include Xanax, Paxil, and Abilify. She has been on Paxil since 2015 and Abilify since 2022. She takes Abilify at night, although she has heard it can cause insomnia. She also takes Paxil and Xanax, with the latter being used more frequently due to increased anxiety. She has a system for managing her medications but sometimes forgets to take them as prescribed.  She has a history of hypertension, with recent blood pressure readings of 150/80 mmHg, which is higher than her previous reading of 134/78 mmHg. She mentions that her aunt recently had a stroke, which has increased her stress levels.  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 She is scheduled for another skin check soon. She expresses concern about the recurrence of these skin issues.  Her family history includes a recent stroke in her aunt, which has contributed to her  current stress levels. She also mentions that her husband passed away four years ago, which has left her feeling overwhelmed with managing household responsibilities.  Hypertension:  Valsartan 320 mg daily.    Hyperipidemia:  Crestor 5 mg daily.     GAD:  paroxetine 20 mg daily, abilify 2 mg daily, and xanax 0.5 mg twice daily.       08/18/2023   10:19 AM 05/13/2023   11:00 AM 04/15/2023   10:33 AM 02/10/2023    3:37 PM 12/31/2022   10:59 AM  Depression screen PHQ 2/9  Decreased Interest 2 1 0 0 1  Down, Depressed, Hopeless 2 1 0 1 1  PHQ - 2 Score 4 2 0 1 2  Altered sleeping 1 1  1 2   Tired, decreased energy 1 0  1 0  Change in appetite 0 1  0 1  Feeling bad or failure about yourself  0 1  0 0  Trouble concentrating 0 0  1 0  Moving slowly or fidgety/restless 1 0  0 0  Suicidal thoughts 0 0  0 0  PHQ-9 Score 7 5  4 5   Difficult doing work/chores Somewhat difficult Very difficult Not difficult at all Not difficult at all Not difficult at all        08/18/2023   10:19 AM  Fall Risk   Falls in the past year? 0  Number falls in past yr: 0  Injury with Fall? 0  Risk for fall due to : No Fall Risks  Follow up Falls evaluation completed    Patient Care  Team: Renne Crigler, FNP as PCP - General (Family Medicine)   Review of Systems  Constitutional:  Negative for chills, fatigue and fever.  HENT:  Negative for congestion, ear pain and sore throat.   Respiratory:  Negative for cough and shortness of breath.   Cardiovascular:  Negative for chest pain and palpitations.  Gastrointestinal:  Negative for abdominal pain, constipation, diarrhea, nausea and vomiting.  Endocrine: Negative for polydipsia, polyphagia and polyuria.  Genitourinary:  Negative for difficulty urinating, dysuria, frequency and urgency.  Musculoskeletal:  Negative for arthralgias, back pain and myalgias.  Skin:  Negative for rash.  Neurological:  Negative for headaches.  Psychiatric/Behavioral:  Positive for  confusion (recently). Negative for dysphoric mood, sleep disturbance and suicidal ideas. The patient is nervous/anxious.     Current Outpatient Medications on File Prior to Visit  Medication Sig Dispense Refill   ALPRAZolam (XANAX) 0.5 MG tablet Take 1 tablet (0.5 mg total) by mouth 2 (two) times daily as needed. for anxiety 60 tablet 3   ARIPiprazole (ABILIFY) 2 MG tablet Take 1 tablet (2 mg total) by mouth at bedtime as needed. 90 tablet 1   PARoxetine (PAXIL) 20 MG tablet Take 1 tablet (20 mg total) by mouth daily. 90 tablet 1   rosuvastatin (CRESTOR) 5 MG tablet Take 1 tablet (5 mg total) by mouth daily. 90 tablet 1   valsartan (DIOVAN) 320 MG tablet Take 1 tablet (320 mg total) by mouth daily. 90 tablet 0   No current facility-administered medications on file prior to visit.   Past Medical History:  Diagnosis Date   Anxiety    Cancer (HCC)    Basal and squamous cell skin cancer   Hyperlipidemia    Hypertension    Osteopenia 08/2014   T score -1.9 FRAX 11%/1.8%   Past Surgical History:  Procedure Laterality Date   Basal and squamous cell skin cancers excised      OVARIAN CYST REMOVAL      Family History  Problem Relation Age of Onset   Diabetes Mother    Alzheimer's disease Mother    Melanoma Brother    Diabetes Maternal Aunt    Melanoma Maternal Uncle    Diabetes Maternal Uncle    Heart disease Maternal Uncle    Melanoma Maternal Uncle    Heart disease Maternal Uncle    Social History   Socioeconomic History   Marital status: Married    Spouse name: Not on file   Number of children: Not on file   Years of education: Not on file   Highest education level: Not on file  Occupational History   Not on file  Tobacco Use   Smoking status: Never   Smokeless tobacco: Never  Vaping Use   Vaping status: Never Used  Substance and Sexual Activity   Alcohol use: Yes    Alcohol/week: 1.0 standard drink of alcohol    Types: 1 Glasses of wine per week    Comment:  socially   Drug use: No   Sexual activity: Not Currently    Birth control/protection: Post-menopausal    Comment: 1st intercourse 73 yo--1 partner  Other Topics Concern   Not on file  Social History Narrative   Not on file   Social Drivers of Health   Financial Resource Strain: Low Risk  (02/10/2023)   Overall Financial Resource Strain (CARDIA)    Difficulty of Paying Living Expenses: Not hard at all  Food Insecurity: No Food Insecurity (02/10/2023)   Hunger Vital  Sign    Worried About Programme researcher, broadcasting/film/video in the Last Year: Never true    Ran Out of Food in the Last Year: Never true  Transportation Needs: No Transportation Needs (02/10/2023)   PRAPARE - Administrator, Civil Service (Medical): No    Lack of Transportation (Non-Medical): No  Physical Activity: Insufficiently Active (02/10/2023)   Exercise Vital Sign    Days of Exercise per Week: 3 days    Minutes of Exercise per Session: 20 min  Stress: No Stress Concern Present (02/10/2023)   [employer] of Occupational Health - Occupational Stress Questionnaire    Feeling of Stress : Not at all  Social Connections: Moderately Isolated (02/10/2023)   Social Connection and Isolation Panel [NHANES]    Frequency of Communication with Friends and Family: More than three times a week    Frequency of Social Gatherings with Friends and Family: More than three times a week    Attends Religious Services: More than 4 times per year    Active Member of [employer] or Organizations: No    Attends Banker Meetings: Never    Marital Status: Widowed    Objective:  BP (!) 160/80   Pulse 68   Temp (!) 97.3 F (36.3 C)   Resp 14   Ht 5\' 7"  (1.702 m)   Wt 151 lb (68.5 kg)   SpO2 100%   BMI 23.65 kg/m      08/18/2023   11:01 AM 08/18/2023   10:09 AM 05/13/2023   10:53 AM  BP/Weight  Systolic BP 160 150 134  Diastolic BP 80 80 78  Wt. (Lbs)  151 153  BMI  23.65 kg/m2 23.96 kg/m2    Physical  Exam Constitutional:      General: She is not in acute distress.    Appearance: Normal appearance. She is not ill-appearing.  Eyes:     Conjunctiva/sclera: Conjunctivae normal.  Cardiovascular:     Rate and Rhythm: Normal rate and regular rhythm.     Heart sounds: Normal heart sounds. No murmur heard. Pulmonary:     Effort: Pulmonary effort is normal.     Breath sounds: Normal breath sounds. No wheezing.  Neurological:     Mental Status: She is alert. Mental status is at baseline.  Psychiatric:        Mood and Affect: Mood normal.        Behavior: Behavior normal.    Lab Results  Component Value Date   WBC 3.8 04/15/2023   HGB 11.9 04/15/2023   HCT 37.6 04/15/2023   PLT 248 04/15/2023   GLUCOSE 101 (H) 04/15/2023   CHOL 193 04/15/2023   TRIG 86 04/15/2023   HDL 76 04/15/2023   LDLCALC 102 (H) 04/15/2023   ALT 14 04/15/2023   AST 20 04/15/2023   NA 142 04/15/2023   K 5.1 04/15/2023   CL 104 04/15/2023   CREATININE 0.96 04/15/2023   BUN 15 04/15/2023   CO2 24 04/15/2023   TSH 2.990 08/16/2022      Assessment & Plan:    HTN (hypertension), benign Assessment & Plan: Chronic Elevated blood pressure at today's visit. Discussed the importance of home blood pressure monitoring before considering medication adjustment. BP Readings from Last 3 Encounters:  08/18/23 (!) 160/80  05/13/23 134/78  04/15/23 (!) 150/80    -Recheck blood pressure before leaving today. -Advise patient to monitor blood pressure at home and keep a BP log  -Fu in 2 weeks  Orders: -     CBC with Differential/Platelet -     Comprehensive metabolic panel  Mixed hyperlipidemia Assessment & Plan: Well controlled.  No changes to medicines. Rosuvastatin 5 mg daily Continue to work on eating a healthy diet and exercise.  Labs drawn today.    Orders: -     Lipid panel  GAD (generalized anxiety disorder) Assessment & Plan: Not well controlled    08/18/2023   10:20 AM 05/13/2023   11:01  AM 04/15/2023   10:33 AM 02/10/2023    4:26 PM  GAD 7 : Generalized Anxiety Score  Nervous, Anxious, on Edge 3 1 0 1  Control/stop worrying 3 2 0 1  Worry too much - different things 3 2 0 1  Trouble relaxing 2 2 0 1  Restless 2 1 0 0  Easily annoyed or irritable 2 0 0 0  Afraid - awful might happen 2 1 0 0  Total GAD 7 Score 17 9 0 4  Anxiety Difficulty Somewhat difficult  Not difficult at all Not difficult at all   Patient reports increased stress and difficulty sleeping, possibly related to family stressors. Patient is on Xanax, Paxil, and Abilify. Discussed the importance of consistent medication use and potential side effects of current medications. -Recommend taking half a Xanax during the day and a whole one at night for sleep. -Shift Abilify to morning dose to reduce insomnia. -Consider adding magnesium and L-lysine supplements for anxiety.    Depression, major, single episode, in partial remission [facility]) Assessment & Plan: Stable    08/18/2023   10:19 AM 05/13/2023   11:00 AM 04/15/2023   10:33 AM  PHQ9 SCORE ONLY  PHQ-9 Total Score 7 5 0  -Continue Paxil 20 mg once daily -Refused counseling due to adequate support system   Elevated serum glucose Assessment & Plan: Labs drawn, Await labs/testing for assessment and recommendations   Orders: -     Hemoglobin A1c  Altered mental status, unspecified altered mental status type Assessment & Plan: Acute -daughter in law is worried about the recent confusion exhibited recently. -check a UA to make sure it isn't an asymptomatic UTI causing the confusion -UA - negative, will conduct memory testing at next visit  Orders: -     POCT URINALYSIS DIP (CLIN[facility]K)     No orders of the defined types were placed in this encounter.   Orders Placed This Encounter  Procedures   CBC with Differential/Platelet   Comprehensive metabolic panel   Lipid panel   Hemoglobin A1c   POCT URINALYSIS DIP (CLINITEK)     Follow-up:  Return in about 3 months (around 11/15/2023) for chronic, 2 weeks BP recheck and memory testing - bring BP log .  An After Visit Summary was printed and given to the patient.  Total time spent on today's visit was 40 minutes, including both face-to-face time and nonface-to-face time personally spent on review of chart (labs and imaging), discussing labs and goals, discussing further work-up, treatment options, referrals to specialist if needed, reviewing outside records if pertinent, answering patient's questions, and coordinating care.   Lajuana Matte, FNP [facility] [phone]

## 2023-08-18 NOTE — Assessment & Plan Note (Signed)
Well controlled.  No changes to medicines. Rosuvastatin 5 mg daily. Continue to work on eating a healthy diet and exercise.  Labs drawn today.  

## 2023-08-18 NOTE — Assessment & Plan Note (Addendum)
Chronic Elevated blood pressure at today's visit. Discussed the importance of home blood pressure monitoring before considering medication adjustment. BP Readings from Last 3 Encounters:  08/18/23 (!) 160/80  05/13/23 134/78  04/15/23 (!) 150/80    -Recheck blood pressure before leaving today. -Advise patient to monitor blood pressure at home and keep a BP log  -Fu in 2 weeks

## 2023-08-18 NOTE — Patient Instructions (Signed)
Bring BP log

## 2023-08-18 NOTE — Assessment & Plan Note (Signed)
Acute -daughter in law is worried about the recent confusion exhibited recently. -check a UA to make sure it isn't an asymptomatic UTI causing the confusion -UA - negative, will conduct memory testing at next visit

## 2023-08-19 LAB — CBC WITH DIFFERENTIAL/PLATELET
Basophils Absolute: 0.1 10*3/uL (ref 0.0–0.2)
Basos: 1 %
EOS (ABSOLUTE): 0.1 10*3/uL (ref 0.0–0.4)
Eos: 3 %
Hematocrit: 36.9 % (ref 34.0–46.6)
Hemoglobin: 12.1 g/dL (ref 11.1–15.9)
Immature Grans (Abs): 0 10*3/uL (ref 0.0–0.1)
Immature Granulocytes: 0 %
Lymphocytes Absolute: 1.3 10*3/uL (ref 0.7–3.1)
Lymphs: 28 %
MCH: 29.8 pg (ref 26.6–33.0)
MCHC: 32.8 g/dL (ref 31.5–35.7)
MCV: 91 fL (ref 79–97)
Monocytes Absolute: 0.4 10*3/uL (ref 0.1–0.9)
Monocytes: 7 %
Neutrophils Absolute: 3 10*3/uL (ref 1.4–7.0)
Neutrophils: 61 %
Platelets: 281 10*3/uL (ref 150–450)
RBC: 4.06 x10E6/uL (ref 3.77–5.28)
RDW: 12.4 % (ref 11.7–15.4)
WBC: 4.9 10*3/uL (ref 3.4–10.8)

## 2023-08-19 LAB — HEMOGLOBIN A1C
Est. average glucose Bld gHb Est-mCnc: 128 mg/dL
Hgb A1c MFr Bld: 6.1 % — ABNORMAL HIGH (ref 4.8–5.6)

## 2023-08-19 LAB — LIPID PANEL
Chol/HDL Ratio: 2.5 {ratio} (ref 0.0–4.4)
Cholesterol, Total: 160 mg/dL (ref 100–199)
HDL: 63 mg/dL (ref >39)
LDL Chol Calc (NIH): 82 mg/dL (ref 0–99)
Triglycerides: 78 mg/dL (ref 0–149)
VLDL Cholesterol Cal: 15 mg/dL (ref 5–40)

## 2023-08-19 LAB — COMPREHENSIVE METABOLIC PANEL
ALT: 14 [IU]/L (ref 0–32)
AST: 18 [IU]/L (ref 0–40)
Albumin: 4.5 g/dL (ref 3.8–4.8)
Alkaline Phosphatase: 105 [IU]/L (ref 44–121)
BUN/Creatinine Ratio: 17 (ref 12–28)
BUN: 17 mg/dL (ref 8–27)
Bilirubin Total: 0.6 mg/dL (ref 0.0–1.2)
CO2: 22 mmol/L (ref 20–29)
Calcium: 10.2 mg/dL (ref 8.7–10.3)
Chloride: 103 mmol/L (ref 96–106)
Creatinine, Ser: 1.03 mg/dL — ABNORMAL HIGH (ref 0.57–1.00)
Globulin, Total: 2.4 g/dL (ref 1.5–4.5)
Glucose: 101 mg/dL — ABNORMAL HIGH (ref 70–99)
Potassium: 5.1 mmol/L (ref 3.5–5.2)
Sodium: 140 mmol/L (ref 134–144)
Total Protein: 6.9 g/dL (ref 6.0–8.5)
eGFR: 56 mL/min/{1.73_m2} — ABNORMAL LOW (ref >59)

## 2023-08-26 ENCOUNTER — Other Ambulatory Visit: Payer: Self-pay | Admitting: Family Medicine

## 2023-08-26 DIAGNOSIS — E782 Mixed hyperlipidemia: Secondary | ICD-10-CM

## 2023-09-01 ENCOUNTER — Ambulatory Visit (INDEPENDENT_AMBULATORY_CARE_PROVIDER_SITE_OTHER): Payer: Medicare Other | Admitting: Family Medicine

## 2023-09-01 ENCOUNTER — Encounter: Payer: Self-pay | Admitting: Family Medicine

## 2023-09-01 ENCOUNTER — Ambulatory Visit: Payer: Medicare Other

## 2023-09-01 VITALS — BP 124/64 | HR 60 | Temp 97.6°F | Resp 16 | Ht 67.0 in | Wt 148.8 lb

## 2023-09-01 DIAGNOSIS — F411 Generalized anxiety disorder: Secondary | ICD-10-CM

## 2023-09-01 DIAGNOSIS — F19982 Other psychoactive substance use, unspecified with psychoactive substance-induced sleep disorder: Secondary | ICD-10-CM

## 2023-09-01 DIAGNOSIS — I10 Essential (primary) hypertension: Secondary | ICD-10-CM

## 2023-09-01 NOTE — Assessment & Plan Note (Signed)
 Chronic Improved blood pressure control noted. Patient reports taking Xanax in the morning helps to lower blood pressure. BP Readings from Last 3 Encounters:  09/01/23 124/64  08/18/23 (!) 160/80  05/13/23 134/78    -Continue current management.

## 2023-09-01 NOTE — Progress Notes (Signed)
 Subjective:  Patient ID: Cheryl Myers, female    DOB: 12-Mar-1946  Age: 78 y.o. MRN: [id number]  Chief Complaint  Patient presents with   Hypertension   Discussed the use of AI scribe software for clinical note transcription with the patient, who gave verbal consent to proceed.   HPI  Cheryl Myers is a 78 year old female who presents for blood pressure management.  She has hypertension with a previous blood pressure reading of 160/80 mmHg. She finds that taking Xanax in the morning helps to keep her blood pressure lower. She took Xanax this morning and usually takes it around 11 AM. If her blood pressure is stable, she does not take additional doses. However, she often runs out of Xanax before the end of the month.  She has been taking Abilify, which was initially prescribed to be taken at night. However, she switched to taking it in the morning and finds it more effective. She previously experienced sleep disturbances when taking it at night.  She uses a vitamin gummy for sleep, which contains magnesium and other ingredients. It works some nights, depending on her mental state, and she does not take it consistently, only when she has trouble sleeping.  She mentions feeling the need to help family members, such as her aunt who had a stroke, which contributes to her stress levels.   Hypertension:  Valsartan 320 mg daily. No chest pain, headaches or shortness of breath.   GAD:  paroxetine 20 mg daily, abilify 2 mg daily, and xanax 0.5 mg twice daily.        08/18/2023   10:19 AM 05/13/2023   11:00 AM 04/15/2023   10:33 AM 02/10/2023    3:37 PM 12/31/2022   10:59 AM  Depression screen PHQ 2/9  Decreased Interest 2 1 0 0 1  Down, Depressed, Hopeless 2 1 0 1 1  PHQ - 2 Score 4 2 0 1 2  Altered sleeping 1 1  1 2   Tired, decreased energy 1 0  1 0  Change in appetite 0 1  0 1  Feeling bad or failure about yourself  0 1  0 0  Trouble concentrating 0 0  1 0  Moving slowly or  fidgety/restless 1 0  0 0  Suicidal thoughts 0 0  0 0  PHQ-9 Score 7 5  4 5   Difficult doing work/chores Somewhat difficult Very difficult Not difficult at all Not difficult at all Not difficult at all        08/18/2023   10:19 AM  Fall Risk   Falls in the past year? 0  Number falls in past yr: 0  Injury with Fall? 0  Risk for fall due to : No Fall Risks  Follow up Falls evaluation completed    Patient Care Team: Renne Crigler, FNP as PCP - General (Family Medicine)   Review of Systems  Constitutional:  Negative for chills, diaphoresis, fatigue and fever.  HENT:  Negative for congestion, ear pain and sinus pain.   Respiratory:  Negative for cough and shortness of breath.   Cardiovascular:  Negative for chest pain and leg swelling.  Gastrointestinal:  Negative for abdominal pain, constipation, nausea and vomiting.  Genitourinary:  Negative for dysuria.  Musculoskeletal:  Negative for arthralgias.  Neurological:  Negative for weakness and headaches.  Psychiatric/Behavioral:  Negative for dysphoric mood. The patient is not nervous/anxious.     Current Outpatient Medications on File Prior to Visit  Medication  Sig Dispense Refill   ALPRAZolam (XANAX) 0.5 MG tablet Take 1 tablet (0.5 mg total) by mouth 2 (two) times daily as needed. for anxiety 60 tablet 3   ARIPiprazole (ABILIFY) 2 MG tablet Take 1 tablet (2 mg total) by mouth at bedtime as needed. 90 tablet 1   PARoxetine (PAXIL) 20 MG tablet Take 1 tablet (20 mg total) by mouth daily. 90 tablet 1   rosuvastatin (CRESTOR) 5 MG tablet Take 1 tablet by mouth once daily. 90 tablet 1   valsartan (DIOVAN) 320 MG tablet Take 1 tablet (320 mg total) by mouth daily. 90 tablet 0   No current facility-administered medications on file prior to visit.   Past Medical History:  Diagnosis Date   Anxiety    Cancer (HCC)    Basal and squamous cell skin cancer   Hyperlipidemia    Hypertension    Osteopenia 08/2014   T score -1.9 FRAX  11%/1.8%   Past Surgical History:  Procedure Laterality Date   Basal and squamous cell skin cancers excised      OVARIAN CYST REMOVAL      Family History  Problem Relation Age of Onset   Diabetes Mother    Alzheimer's disease Mother    Melanoma Brother    Diabetes Maternal Aunt    Melanoma Maternal Uncle    Diabetes Maternal Uncle    Heart disease Maternal Uncle    Melanoma Maternal Uncle    Heart disease Maternal Uncle    Social History   Socioeconomic History   Marital status: Married    Spouse name: Not on file   Number of children: Not on file   Years of education: Not on file   Highest education level: Not on file  Occupational History   Not on file  Tobacco Use   Smoking status: Never   Smokeless tobacco: Never  Vaping Use   Vaping status: Never Used  Substance and Sexual Activity   Alcohol use: Yes    Alcohol/week: 1.0 standard drink of alcohol    Types: 1 Glasses of wine per week    Comment: socially   Drug use: No   Sexual activity: Not Currently    Birth control/protection: Post-menopausal    Comment: 1st intercourse 32 yo--1 partner  Other Topics Concern   Not on file  Social History Narrative   Not on file   Social Drivers of Health   Financial Resource Strain: Low Risk  (02/10/2023)   Overall Financial Resource Strain (CARDIA)    Difficulty of Paying Living Expenses: Not hard at all  Food Insecurity: No Food Insecurity (02/10/2023)   Hunger Vital Sign    Worried About Running Out of Food in the Last Year: Never true    Ran Out of Food in the Last Year: Never true  Transportation Needs: No Transportation Needs (02/10/2023)   PRAPARE - Administrator, Civil Service (Medical): No    Lack of Transportation (Non-Medical): No  Physical Activity: Insufficiently Active (02/10/2023)   Exercise Vital Sign    Days of Exercise per Week: 3 days    Minutes of Exercise per Session: 20 min  Stress: No Stress Concern Present (02/10/2023)   [employer] of Occupational Health - Occupational Stress Questionnaire    Feeling of Stress : Not at all  Social Connections: Moderately Isolated (02/10/2023)   Social Connection and Isolation Panel [NHANES]    Frequency of Communication with Friends and Family: More than three times a week  Frequency of Social Gatherings with Friends and Family: More than three times a week    Attends Religious Services: More than 4 times per year    Active Member of Clubs or Organizations: No    Attends Banker Meetings: Never    Marital Status: Widowed    Objective:  BP 124/64 (BP Location: Left Arm, Patient Position: Sitting, Cuff Size: Normal)   Pulse 60   Temp 97.6 F (36.4 C) (Temporal)   Resp 16   Ht 5\' 7"  (1.702 m)   Wt 148 lb 12.8 oz (67.5 kg)   SpO2 99%   BMI 23.31 kg/m      09/01/2023   10:38 AM 08/18/2023   11:01 AM 08/18/2023   10:09 AM  BP/Weight  Systolic BP 124 160 150  Diastolic BP 64 80 80  Wt. (Lbs) 148.8  151  BMI 23.31 kg/m2  23.65 kg/m2    Physical Exam Vitals reviewed.  Constitutional:      General: She is not in acute distress.    Appearance: Normal appearance. She is not ill-appearing.  Eyes:     Conjunctiva/sclera: Conjunctivae normal.  Cardiovascular:     Rate and Rhythm: Normal rate and regular rhythm.     Heart sounds: Normal heart sounds. No murmur heard. Pulmonary:     Effort: Pulmonary effort is normal.     Breath sounds: Normal breath sounds. No wheezing.  Musculoskeletal:        General: Normal range of motion.  Skin:    General: Skin is warm.  Neurological:     Mental Status: She is alert. Mental status is at baseline.  Psychiatric:        Mood and Affect: Mood normal.        Behavior: Behavior normal.      Lab Results  Component Value Date   WBC 4.9 08/18/2023   HGB 12.1 08/18/2023   HCT 36.9 08/18/2023   PLT 281 08/18/2023   GLUCOSE 101 (H) 08/18/2023   CHOL 160 08/18/2023   TRIG 78 08/18/2023   HDL 63 08/18/2023    LDLCALC 82 08/18/2023   ALT 14 08/18/2023   AST 18 08/18/2023   NA 140 08/18/2023   K 5.1 08/18/2023   CL 103 08/18/2023   CREATININE 1.03 (H) 08/18/2023   BUN 17 08/18/2023   CO2 22 08/18/2023   TSH 2.990 08/16/2022   HGBA1C 6.1 (H) 08/18/2023      Assessment & Plan:    Essential hypertension Assessment & Plan: Chronic Improved blood pressure control noted. Patient reports taking Xanax in the morning helps to lower blood pressure. BP Readings from Last 3 Encounters:  09/01/23 124/64  08/18/23 (!) 160/80  05/13/23 134/78    -Continue current management.   Drug-induced insomnia (HCC) Assessment & Plan: Improved Patient reports occasional use of OTC Olly gummy supplement for sleep, with variable effectiveness. -Continue supplement as needed for sleep.    GAD (generalized anxiety disorder) Assessment & Plan: Patient reports taking Xanax in the morning, sometimes running out before the end of the month. -Continue Xanax as needed. -Consider strategies to manage anxiety, such as avoiding negative influences, particularly towards the end of the month when medication may run out.       No orders of the defined types were placed in this encounter.   No orders of the defined types were placed in this encounter.    Follow-up: Return in about 2 months (around 11/01/2023) for chronic.  An After Visit  Summary was printed and given to the patient.  Total time spent on today's visit was 20 minutes, including both face-to-face time and nonface-to-face time personally spent on review of chart (labs and imaging), discussing labs and goals, discussing further work-up, treatment options, referrals to specialist if needed, reviewing outside records if pertinent, answering patient's questions, and coordinating care.    Lajuana Matte, FNP [facility] [phone]

## 2023-09-01 NOTE — Assessment & Plan Note (Signed)
 Improved Patient reports occasional use of OTC Olly gummy supplement for sleep, with variable effectiveness. -Continue supplement as needed for sleep.

## 2023-09-01 NOTE — Assessment & Plan Note (Signed)
 Patient reports taking Xanax in the morning, sometimes running out before the end of the month. -Continue Xanax as needed. -Consider strategies to manage anxiety, such as avoiding negative influences, particularly towards the end of the month when medication may run out.

## 2023-09-18 ENCOUNTER — Other Ambulatory Visit: Payer: Self-pay | Admitting: Family Medicine

## 2023-09-18 DIAGNOSIS — F4321 Adjustment disorder with depressed mood: Secondary | ICD-10-CM

## 2023-10-10 ENCOUNTER — Other Ambulatory Visit: Payer: Self-pay | Admitting: Family Medicine

## 2023-10-10 DIAGNOSIS — F4321 Adjustment disorder with depressed mood: Secondary | ICD-10-CM

## 2023-10-27 ENCOUNTER — Other Ambulatory Visit: Payer: Self-pay | Admitting: Family Medicine

## 2023-10-27 DIAGNOSIS — I10 Essential (primary) hypertension: Secondary | ICD-10-CM

## 2023-11-14 NOTE — Progress Notes (Signed)
 Subjective:  Patient ID: Cheryl Myers, female    DOB: Nov 02, 1945  Age: 78 y.o. MRN: [id number]  Chief Complaint  Patient presents with   Medical Management of Chronic Issues    Discussed the use of AI scribe software for clinical note transcription with the patient, who gave verbal consent to proceed.  History of Present Illness   Cheryl Myers is a 78 year old female who presents for a follow-up visit regarding stress and blood pressure management.  She experiences increased stress due to her daughter and two dogs staying with her temporarily. She describes the situation as 'a lot of stress' and finds her daughter's constant presence and talking overstimulating. She uses Xanax  to help manage her stress and aid sleep.  Her blood pressure has been variable, with a recent reading of 124/64 mmHg, which she found surprisingly good. She monitors her blood pressure at home daily. No chest pain, shortness of breath, swelling in her legs, dizziness, or headaches.  Her past medical history includes prediabetes, with a previous A1c in the prediabetic range. She consumes sweets, particularly 'Nutty Buddies', but also mentions being active around the house and outside.  She reports that her kidney function was previously noted to be decreased, and she has had similar results in the past. She occasionally takes Advil for pain.  She is currently taking Paxil  and believes she may need a refill soon. She also uses Xanax  as needed for stress management.     Current medications: Hypertension:  Valsartan  320 mg daily.   GAD: paroxetine  20 mg daily, abilify  2 mg daily, and xanax  0.5 mg twice daily.      11/17/2023   10:58 AM 08/18/2023   10:19 AM 05/13/2023   11:00 AM 04/15/2023   10:33 AM 02/10/2023    3:37 PM  Depression screen PHQ 2/9  Decreased Interest 0 2 1 0 0  Down, Depressed, Hopeless 0 2 1 0 1  PHQ - 2 Score 0 4 2 0 1  Altered sleeping 0 1 1  1   Tired, decreased energy 0 1 0  1   Change in appetite 0 0 1  0  Feeling bad or failure about yourself  0 0 1  0  Trouble concentrating 0 0 0  1  Moving slowly or fidgety/restless 0 1 0  0  Suicidal thoughts 0 0 0  0  PHQ-9 Score 0 7 5  4   Difficult doing work/chores Not difficult at all Somewhat difficult Very difficult Not difficult at all Not difficult at all        11/17/2023   10:58 AM  Fall Risk   Falls in the past year? 1  Number falls in past yr: 0  Injury with Fall? 0  Risk for fall due to : No Fall Risks  Follow up Falls evaluation completed    Patient Care Team: Janece Means, FNP as PCP - General (Family Medicine)   Review of Systems  Constitutional:  Negative for chills, diaphoresis, fatigue and fever.  HENT:  Negative for congestion, ear pain and sinus pain.   Eyes: Negative.   Respiratory:  Negative for cough and shortness of breath.   Cardiovascular:  Negative for chest pain.  Gastrointestinal:  Negative for abdominal pain, constipation, nausea and vomiting.  Endocrine: Negative.   Genitourinary:  Negative for dysuria.  Musculoskeletal:  Negative for arthralgias.  Skin: Negative.   Allergic/Immunologic: Negative.   Neurological:  Negative for dizziness, weakness and headaches.  Hematological:  Negative.   Psychiatric/Behavioral:  Negative for dysphoric mood and sleep disturbance. The patient is nervous/anxious.     Current Outpatient Medications on File Prior to Visit  Medication Sig Dispense Refill   ALPRAZolam  (XANAX ) 0.5 MG tablet Take 1 tablet (0.5 mg total) by mouth 2 (two) times daily as needed. for anxiety 60 tablet 0   ARIPiprazole  (ABILIFY ) 2 MG tablet Take 1 tablet (2 mg total) by mouth at bedtime as needed. 90 tablet 1   fluorouracil (EFUDEX) 5 % cream Apply 1 Application topically 2 (two) times daily.     rosuvastatin  (CRESTOR ) 5 MG tablet Take 1 tablet by mouth once daily. 90 tablet 1   valsartan  (DIOVAN ) 320 MG tablet Take 1 tablet (320 mg total) by mouth daily. 90 tablet 0    No current facility-administered medications on file prior to visit.   Past Medical History:  Diagnosis Date   Anxiety    Cancer (HCC)    Basal and squamous cell skin cancer   Hyperlipidemia    Hypertension    Osteopenia 08/2014   T score -1.9 FRAX 11%/1.8%   Past Surgical History:  Procedure Laterality Date   Basal and squamous cell skin cancers excised      OVARIAN CYST REMOVAL      Family History  Problem Relation Age of Onset   Diabetes Mother    Alzheimer's disease Mother    Melanoma Brother    Diabetes Maternal Aunt    Melanoma Maternal Uncle    Diabetes Maternal Uncle    Heart disease Maternal Uncle    Melanoma Maternal Uncle    Heart disease Maternal Uncle    Social History   Socioeconomic History   Marital status: Married    Spouse name: Not on file   Number of children: Not on file   Years of education: Not on file   Highest education level: Not on file  Occupational History   Not on file  Tobacco Use   Smoking status: Never   Smokeless tobacco: Never  Vaping Use   Vaping status: Never Used  Substance and Sexual Activity   Alcohol use: Yes    Alcohol/week: 1.0 standard drink of alcohol    Types: 1 Glasses of wine per week    Comment: socially   Drug use: No   Sexual activity: Not Currently    Birth control/protection: Post-menopausal    Comment: 1st intercourse 7 yo--1 partner  Other Topics Concern   Not on file  Social History Narrative   Not on file   Social Drivers of Health   Financial Resource Strain: Low Risk  (02/10/2023)   Overall Financial Resource Strain (CARDIA)    Difficulty of Paying Living Expenses: Not hard at all  Food Insecurity: No Food Insecurity (02/10/2023)   Hunger Vital Sign    Worried About Running Out of Food in the Last Year: Never true    Ran Out of Food in the Last Year: Never true  Transportation Needs: No Transportation Needs (02/10/2023)   PRAPARE - Administrator, Civil Service (Medical): No     Lack of Transportation (Non-Medical): No  Physical Activity: Insufficiently Active (02/10/2023)   Exercise Vital Sign    Days of Exercise per Week: 3 days    Minutes of Exercise per Session: 20 min  Stress: No Stress Concern Present (02/10/2023)   [employer] of Occupational Health - Occupational Stress Questionnaire    Feeling of Stress : Not at all  Social  Connections: Moderately Isolated (02/10/2023)   Social Connection and Isolation Panel [NHANES]    Frequency of Communication with Friends and Family: More than three times a week    Frequency of Social Gatherings with Friends and Family: More than three times a week    Attends Religious Services: More than 4 times per year    Active Member of [employer] or Organizations: No    Attends Banker Meetings: Never    Marital Status: Widowed    Objective:  BP 138/70   Pulse 64   Temp 98 F (36.7 C) (Temporal)   Resp 16   Ht 5\' 7"  (1.702 m)   Wt 153 lb (69.4 kg)   SpO2 98%   BMI 23.96 kg/m      11/17/2023   10:52 AM 09/01/2023   10:38 AM 08/18/2023   11:01 AM  BP/Weight  Systolic BP 138 124 160  Diastolic BP 70 64 80  Wt. (Lbs) 153 148.8   BMI 23.96 kg/m2 23.31 kg/m2     Physical Exam Vitals reviewed.  Constitutional:      General: She is not in acute distress.    Appearance: Normal appearance. She is not ill-appearing.  Eyes:     Conjunctiva/sclera: Conjunctivae normal.  Cardiovascular:     Rate and Rhythm: Normal rate and regular rhythm.     Heart sounds: Normal heart sounds. No murmur heard. Pulmonary:     Effort: Pulmonary effort is normal.     Breath sounds: Normal breath sounds. No wheezing.  Musculoskeletal:        General: Normal range of motion.  Skin:    General: Skin is warm.  Neurological:     Mental Status: She is alert. Mental status is at baseline.  Psychiatric:        Mood and Affect: Mood normal.        Behavior: Behavior normal.     Lab Results  Component Value Date   WBC 4.9  08/18/2023   HGB 12.1 08/18/2023   HCT 36.9 08/18/2023   PLT 281 08/18/2023   GLUCOSE 101 (H) 08/18/2023   CHOL 160 08/18/2023   TRIG 78 08/18/2023   HDL 63 08/18/2023   LDLCALC 82 08/18/2023   ALT 14 08/18/2023   AST 18 08/18/2023   NA 140 08/18/2023   K 5.1 08/18/2023   CL 103 08/18/2023   CREATININE 1.03 (H) 08/18/2023   BUN 17 08/18/2023   CO2 22 08/18/2023   TSH 2.990 08/16/2022   HGBA1C 6.1 (H) 08/18/2023      Assessment & Plan:  Essential hypertension Assessment & Plan: Hypertension likely stress-induced. Previous BP 124/64 mmHg. Discussed importance of BP control to prevent complications. BP Readings from Last 3 Encounters:  11/17/23 138/70  09/01/23 124/64  08/18/23 (!) 160/80    - Recheck BP today. (152/96) - If elevated, follow up in two weeks for BP check. - Provide BP log for home monitoring.  Orders: -     CMP14+EGFR -     CBC with Differential/Platelet  Mixed hyperlipidemia -     Lipid panel  GAD (generalized anxiety disorder)  Prediabetes Assessment & Plan: Not currently on any medication - Admits to eating too many "nutty buddies" Lab Results  Component Value Date   HGBA1C 6.1 (H) 08/18/2023  Labs drawn today, Await labs/testing for assessment and recommendations   Orders: -     Hemoglobin A1c; Future  Depression, major, single episode, in partial remission [facility]) Assessment & Plan: Depression  managed with Paxil . Stress from daughter's situation may affect mental health. Xanax  used for acute stress.    11/17/2023   10:58 AM 08/18/2023   10:19 AM 05/13/2023   11:00 AM  PHQ9 SCORE ONLY  PHQ-9 Total Score 0 7 5    - Refill Paxil  prescription. - Advise on stress management techniques.  Orders: -     PARoxetine  HCl; Take 1 tablet (20 mg total) by mouth daily.  Dispense: 90 tablet; Refill: 0         Meds ordered this encounter  Medications   PARoxetine  (PAXIL ) 20 MG tablet    Sig: Take 1 tablet (20 mg total) by mouth daily.     Dispense:  90 tablet    Refill:  0    Orders Placed This Encounter  Procedures   Hemoglobin A1c   Lipid panel   CMP14+EGFR   CBC with Differential/Platelet     Follow-up: Return in about 2 weeks (around 12/01/2023) for BP recheck.    An After Visit Summary was printed and given to the patient.  Delford Felling, FNP [facility] [phone]

## 2023-11-15 ENCOUNTER — Other Ambulatory Visit: Payer: Self-pay | Admitting: Family Medicine

## 2023-11-15 DIAGNOSIS — F4321 Adjustment disorder with depressed mood: Secondary | ICD-10-CM

## 2023-11-17 ENCOUNTER — Ambulatory Visit (INDEPENDENT_AMBULATORY_CARE_PROVIDER_SITE_OTHER): Payer: Medicare Other | Admitting: Family Medicine

## 2023-11-17 ENCOUNTER — Encounter: Payer: Self-pay | Admitting: Family Medicine

## 2023-11-17 ENCOUNTER — Other Ambulatory Visit: Payer: Self-pay | Admitting: Family Medicine

## 2023-11-17 VITALS — BP 152/96 | HR 64 | Temp 98.0°F | Resp 16 | Ht 67.0 in | Wt 153.0 lb

## 2023-11-17 DIAGNOSIS — F411 Generalized anxiety disorder: Secondary | ICD-10-CM

## 2023-11-17 DIAGNOSIS — R7303 Prediabetes: Secondary | ICD-10-CM | POA: Insufficient documentation

## 2023-11-17 DIAGNOSIS — F324 Major depressive disorder, single episode, in partial remission: Secondary | ICD-10-CM

## 2023-11-17 DIAGNOSIS — E782 Mixed hyperlipidemia: Secondary | ICD-10-CM

## 2023-11-17 DIAGNOSIS — F4321 Adjustment disorder with depressed mood: Secondary | ICD-10-CM

## 2023-11-17 DIAGNOSIS — I10 Essential (primary) hypertension: Secondary | ICD-10-CM | POA: Diagnosis not present

## 2023-11-17 MED ORDER — ALPRAZOLAM 0.5 MG PO TABS: 0.5000 mg | ORAL_TABLET | Freq: Two times a day (BID) | ORAL | 0 refills | Status: DC | PRN

## 2023-11-17 MED ORDER — PAROXETINE HCL 20 MG PO TABS: 20.0000 mg | ORAL_TABLET | Freq: Every day | ORAL | 0 refills | Status: DC

## 2023-11-17 NOTE — Assessment & Plan Note (Signed)
 Depression managed with Paxil . Stress from daughter's situation may affect mental health. Xanax  used for acute stress.    11/17/2023   10:58 AM 08/18/2023   10:19 AM 05/13/2023   11:00 AM  PHQ9 SCORE ONLY  PHQ-9 Total Score 0 7 5    - Refill Paxil  prescription. - Advise on stress management techniques.

## 2023-11-17 NOTE — Assessment & Plan Note (Signed)
 Not currently on any medication - Admits to eating too many "nutty buddies" Lab Results  Component Value Date   HGBA1C 6.1 (H) 08/18/2023  Labs drawn today, Await labs/testing for assessment and recommendations

## 2023-11-17 NOTE — Assessment & Plan Note (Addendum)
 Hypertension likely stress-induced. Previous BP 124/64 mmHg. Discussed importance of BP control to prevent complications. BP Readings from Last 3 Encounters:  11/17/23 138/70  09/01/23 124/64  08/18/23 (!) 160/80    - Recheck BP today. (152/96) - If elevated, follow up in two weeks for BP check. - Provide BP log for home monitoring.

## 2023-11-17 NOTE — Telephone Encounter (Signed)
 Copied from CRM [id number]. Topic: Clinical - Medication Refill >> Nov 17, 2023 12:01 PM Ivette P wrote: Medication: ALPRAZolam  (XANAX ) 0.5 MG tablet  Has the patient contacted their pharmacy? Yes (Agent: If no, request that the patient contact the pharmacy for the refill. If patient does not wish to contact the pharmacy document the reason why and proceed with request.) (Agent: If yes, when and what did the pharmacy advise?)  This is the patient's preferred pharmacy:  [facility] [address] - [place], [place] - [address] [place] [address] Phone: [phone] Fax: [phone]  Is this the correct pharmacy for this prescription? Yes If no, delete pharmacy and type the correct one.   Has the prescription been filled recently? Yes, 10/21/2023  Is the patient out of the medication? Yes  Has the patient been seen for an appointment in the last year OR does the patient have an upcoming appointment? Yes  Can we respond through MyChart? No  Agent: Please be advised that Rx refills may take up to 3 business days. We ask that you follow-up with your pharmacy.

## 2023-11-18 LAB — CBC WITH DIFFERENTIAL/PLATELET
Basophils Absolute: 0 10*3/uL (ref 0.0–0.2)
Basos: 1 %
EOS (ABSOLUTE): 0.1 10*3/uL (ref 0.0–0.4)
Eos: 2 %
Hematocrit: 35.9 % (ref 34.0–46.6)
Hemoglobin: 11.4 g/dL (ref 11.1–15.9)
Immature Grans (Abs): 0 10*3/uL (ref 0.0–0.1)
Immature Granulocytes: 0 %
Lymphocytes Absolute: 1.3 10*3/uL (ref 0.7–3.1)
Lymphs: 33 %
MCH: 29.5 pg (ref 26.6–33.0)
MCHC: 31.8 g/dL (ref 31.5–35.7)
MCV: 93 fL (ref 79–97)
Monocytes Absolute: 0.3 10*3/uL (ref 0.1–0.9)
Monocytes: 8 %
Neutrophils Absolute: 2.2 10*3/uL (ref 1.4–7.0)
Neutrophils: 56 %
Platelets: 252 10*3/uL (ref 150–450)
RBC: 3.87 x10E6/uL (ref 3.77–5.28)
RDW: 12.7 % (ref 11.7–15.4)
WBC: 3.9 10*3/uL (ref 3.4–10.8)

## 2023-11-18 LAB — CMP14+EGFR
ALT: 15 IU/L (ref 0–32)
AST: 17 IU/L (ref 0–40)
Albumin: 4.4 g/dL (ref 3.8–4.8)
Alkaline Phosphatase: 117 IU/L (ref 44–121)
BUN/Creatinine Ratio: 18 (ref 12–28)
BUN: 17 mg/dL (ref 8–27)
Bilirubin Total: 0.6 mg/dL (ref 0.0–1.2)
CO2: 22 mmol/L (ref 20–29)
Calcium: 9.4 mg/dL (ref 8.7–10.3)
Chloride: 104 mmol/L (ref 96–106)
Creatinine, Ser: 0.93 mg/dL (ref 0.57–1.00)
Globulin, Total: 2.3 g/dL (ref 1.5–4.5)
Glucose: 95 mg/dL (ref 70–99)
Potassium: 4.6 mmol/L (ref 3.5–5.2)
Sodium: 140 mmol/L (ref 134–144)
Total Protein: 6.7 g/dL (ref 6.0–8.5)
eGFR: 63 mL/min/{1.73_m2} (ref >59)

## 2023-11-18 LAB — LIPID PANEL
Chol/HDL Ratio: 2.5 ratio (ref 0.0–4.4)
Cholesterol, Total: 157 mg/dL (ref 100–199)
HDL: 63 mg/dL (ref >39)
LDL Chol Calc (NIH): 80 mg/dL (ref 0–99)
Triglycerides: 70 mg/dL (ref 0–149)
VLDL Cholesterol Cal: 14 mg/dL (ref 5–40)

## 2023-11-19 ENCOUNTER — Ambulatory Visit: Payer: Self-pay | Admitting: Family Medicine

## 2023-11-27 ENCOUNTER — Other Ambulatory Visit: Payer: Self-pay | Admitting: Family Medicine

## 2023-12-01 ENCOUNTER — Ambulatory Visit

## 2023-12-01 NOTE — Progress Notes (Signed)
 Patient is in office today for a nurse visit for Blood Pressure Check. Patient blood pressure was 136/80, Patient No chest pain, No shortness of breath, No dyspnea on exertion, No orthopnea, No paroxysmal nocturnal dyspnea, No edema, No palpitations, No syncope.   I informed patient I would let her provider know and if there were any changes I would call her.

## 2023-12-15 ENCOUNTER — Other Ambulatory Visit: Payer: Self-pay | Admitting: Family Medicine

## 2023-12-15 DIAGNOSIS — F4321 Adjustment disorder with depressed mood: Secondary | ICD-10-CM

## 2023-12-16 ENCOUNTER — Other Ambulatory Visit: Payer: Self-pay

## 2023-12-16 DIAGNOSIS — F4321 Adjustment disorder with depressed mood: Secondary | ICD-10-CM

## 2023-12-16 MED ORDER — ALPRAZOLAM 0.5 MG PO TABS: 0.5000 mg | ORAL_TABLET | Freq: Two times a day (BID) | ORAL | 0 refills | Status: DC | PRN

## 2024-01-07 ENCOUNTER — Ambulatory Visit: Payer: Self-pay

## 2024-01-07 NOTE — Telephone Encounter (Signed)
 FYI Only or Action Required?: FYI only for provider.  Patient was last seen in primary care on 11/17/2023 by Teressa Harrie HERO, FNP.  Called Nurse Triage reporting Hypotension.  Symptoms began today.  Interventions attempted: Rest, hydration, or home remedies.  Symptoms are: gradually improving.  Triage Disposition: See Physician Within 24 Hours  Patient/caregiver understands and will follow disposition?: Yes  1. BLOOD PRESSURE: What is your blood pressure? Did you take at least two measurements 5 minutes apart? 111/58, pulse 88 2. ONSET: When did you take your blood pressure? Low blood pressure about 30 minutes ago 3. HOW: How did you take your blood pressure? (e.g., visiting nurse, automatic home BP monitor) Automatic cuff 4. HISTORY: Do you have a history of low blood pressure? What is your blood pressure normally? denies 5. MEDICINES: Are you taking any medicines for blood pressure? If Yes, ask: Have they been changed recently? denies 6. PULSE RATE: Do you know what your pulse rate is?  Pulse 88 7. OTHER SYMPTOMS: Have you been sick recently? Have you had a recent injury? denies   Copied from CRM [id number]. Topic: Clinical - Medical Advice >> Jan 07, 2024  3:19 PM Santiya F wrote: Reason for CRM: Patient's daughter Sari is calling in because patient was outside cleaning the windows and got dizzy. She says patient's blood pressure is currently 94/50 and wanted to know was that okay or should she take her to the hospital. Please advise. Reason for Disposition  [1] Systolic BP 90-110 AND [2] taking blood pressure medications AND [3] NOT feeling weak or lightheaded  Protocols used: Blood Pressure - Low-A-AH

## 2024-01-08 ENCOUNTER — Ambulatory Visit: Admitting: Family Medicine

## 2024-01-12 ENCOUNTER — Other Ambulatory Visit: Payer: Self-pay | Admitting: Family Medicine

## 2024-01-12 DIAGNOSIS — F4321 Adjustment disorder with depressed mood: Secondary | ICD-10-CM

## 2024-01-12 DIAGNOSIS — I10 Essential (primary) hypertension: Secondary | ICD-10-CM

## 2024-02-09 ENCOUNTER — Other Ambulatory Visit: Payer: Self-pay | Admitting: Family Medicine

## 2024-02-09 DIAGNOSIS — F4321 Adjustment disorder with depressed mood: Secondary | ICD-10-CM

## 2024-02-12 ENCOUNTER — Ambulatory Visit: Payer: Medicare Other

## 2024-02-12 ENCOUNTER — Encounter: Payer: Self-pay | Admitting: Family Medicine

## 2024-02-12 ENCOUNTER — Ambulatory Visit (INDEPENDENT_AMBULATORY_CARE_PROVIDER_SITE_OTHER): Admitting: Family Medicine

## 2024-02-12 VITALS — BP 128/80 | HR 67 | Temp 98.7°F | Resp 16 | Ht 67.0 in | Wt 150.0 lb

## 2024-02-12 DIAGNOSIS — Z0000 Encounter for general adult medical examination without abnormal findings: Secondary | ICD-10-CM | POA: Diagnosis not present

## 2024-02-12 NOTE — Progress Notes (Addendum)
 Subjective:   Cheryl Myers is a 78 y.o. female who presents for Medicare Annual (Subsequent) preventive examination.  Visit Complete: In person  Patient Medicare AWV questionnaire was completed by the patient on 02/12/2024; I have confirmed that all information answered by patient is correct and no changes since this date.  Cardiac Risk Factors include: advanced age (>98men, >66 women);hypertension;sedentary lifestyle    Objective:    Today's Vitals   02/12/24 1346  BP: 128/80  Pulse: 67  Resp: 16  Temp: 98.7 F (37.1 C)  TempSrc: Temporal  SpO2: 97%  Weight: 150 lb (68 kg)  Height: 5' 7 (1.702 m)  PainSc: 0-No pain   Body mass index is 23.49 kg/m.     02/10/2023    3:36 PM 01/18/2018   12:18 AM  Advanced Directives  Does Patient Have a Medical Advance Directive? No No   Would patient like information on creating a medical advance directive?  No - Patient declined      Data saved with a previous flowsheet row definition    Current Medications (verified) Outpatient Encounter Medications as of 02/12/2024  Medication Sig   ALPRAZolam (XANAX) 0.5 MG tablet Take 1 tablet by mouth 2 times daily as needed. for anxiety   ARIPiprazole (ABILIFY) 2 MG tablet Take 1 tablet (2 mg total) by mouth at bedtime as needed.   fluorouracil (EFUDEX) 5 % cream Apply 1 Application topically 2 (two) times daily.   PARoxetine (PAXIL) 20 MG tablet Take 1 tablet (20 mg total) by mouth daily.   rosuvastatin (CRESTOR) 5 MG tablet Take 1 tablet by mouth once daily.   valsartan (DIOVAN) 320 MG tablet Take 1 tablet (320 mg total) by mouth daily.   No facility-administered encounter medications on file as of 02/12/2024.    Allergies (verified) Patient has no known allergies.   History: Past Medical History:  Diagnosis Date   Anxiety    Cancer (HCC)    Basal and squamous cell skin cancer   Hyperlipidemia    Hypertension    Osteopenia 08/2014   T score -1.9 FRAX 11%/1.8%   Past  Surgical History:  Procedure Laterality Date   Basal and squamous cell skin cancers excised      OVARIAN CYST REMOVAL     Family History  Problem Relation Age of Onset   Diabetes Mother    Alzheimer's disease Mother    Melanoma Brother    Diabetes Maternal Aunt    Melanoma Maternal Uncle    Diabetes Maternal Uncle    Heart disease Maternal Uncle    Melanoma Maternal Uncle    Heart disease Maternal Uncle    Social History   Socioeconomic History   Marital status: Married    Spouse name: Not on file   Number of children: Not on file   Years of education: Not on file   Highest education level: Not on file  Occupational History   Not on file  Tobacco Use   Smoking status: Never   Smokeless tobacco: Never  Vaping Use   Vaping status: Never Used  Substance and Sexual Activity   Alcohol use: Yes    Alcohol/week: 1.0 standard drink of alcohol    Types: 1 Glasses of wine per week    Comment: socially   Drug use: No   Sexual activity: Not Currently    Birth control/protection: Post-menopausal    Comment: 1st intercourse 55 yo--1 partner  Other Topics Concern   Not on file  Social  History Narrative   Not on file   Social Drivers of Health   Financial Resource Strain: Low Risk  (02/10/2023)   Overall Financial Resource Strain (CARDIA)    Difficulty of Paying Living Expenses: Not hard at all  Food Insecurity: No Food Insecurity (02/12/2024)   Hunger Vital Sign    Worried About Running Out of Food in the Last Year: Never true    Ran Out of Food in the Last Year: Never true  Transportation Needs: No Transportation Needs (02/12/2024)   PRAPARE - Administrator, Civil Service (Medical): No    Lack of Transportation (Non-Medical): No  Physical Activity: Insufficiently Active (02/10/2023)   Exercise Vital Sign    Days of Exercise per Week: 3 days    Minutes of Exercise per Session: 20 min  Stress: No Stress Concern Present (02/10/2023)   [employer] of  Occupational Health - Occupational Stress Questionnaire    Feeling of Stress : Not at all  Social Connections: Moderately Isolated (02/10/2023)   Social Connection and Isolation Panel    Frequency of Communication with Friends and Family: More than three times a week    Frequency of Social Gatherings with Friends and Family: More than three times a week    Attends Religious Services: More than 4 times per year    Active Member of [employer] or Organizations: No    Attends Banker Meetings: Never    Marital Status: Widowed    Tobacco Counseling Counseling given: Not Answered   Clinical Intake:  Pre-visit preparation completed: Yes  Pain : No/denies pain Pain Score: 0-No pain     BMI - recorded: 23.49 Nutritional Status: BMI of 19-24  Normal Nutritional Risks: None Diabetes: No  How often do you need to have someone help you when you read instructions, pamphlets, or other written materials from your doctor or pharmacy?: 1 - Never What is the last grade level you completed in school?: COLLEGE  Interpreter Needed?: No    Activities of Daily Living    02/12/2024    1:53 PM  In your present state of health, do you have any difficulty performing the following activities:  Hearing? 0  Vision? 0  Difficulty concentrating or making decisions? 0  Walking or climbing stairs? 0  Dressing or bathing? 0  Doing errands, shopping? 0  Preparing Food and eating ? N  Using the Toilet? N  In the past six months, have you accidently leaked urine? N  Do you have problems with loss of bowel control? N  Managing your Medications? N  Managing your Finances? N  Housekeeping or managing your Housekeeping? N    Patient Care Team: Teressa Harrie HERO, FNP as PCP - General (Family Medicine)  Indicate any recent Medical Services you may have received from other than [facility] providers in the past year (date may be approximate).     Assessment:   This is a routine wellness examination for  [place].  Hearing/Vision screen No results found.   Goals Addressed             This Visit's Progress    Manage My Medicine   On track    Timeframe:  Long-Range Goal Priority:  High Start Date:                             Expected End Date:  Follow Up Date 04/13/22   - call for medicine refill 2 or 3 days before it runs out - use a pillbox to sort medicine    Why is this important?   These steps will help you keep on track with your medicines.   Notes:      Social and Functional Skills Optimized   On track    Evidence-based guidance:  Assess level of social support; promote maintaining links with family, friends and community to reduce social isolation.  Assess level of function related to basic activities of daily living that include eating, dressing, bathing, as well as instrumental activities of daily living such as shopping, managing finances and use of devices.  Encourage continuation of daily life components such as self-care, home maintenance, financial management, volunteer activities, education opportunities and hobbies.  Refer to occupational or physical therapy to develop comprehensive rehabilitation plan to improve or maintain activities of daily living; consider inclusion of endurance, balance and resistance-training.  Consider complementary therapy such as yoga, music, gardening, outdoor activities, aromatherapy and tai chi.   Notes:      Track and Manage My Symptoms-Depression   On track    Timeframe:  Long-Range Goal Priority:  High Start Date:                             Expected End Date:                       Follow Up Date 02/11/25   - avoid negative self-talk - spend time or talk with others every day - watch for early signs of feeling worse    Why is this important?   Keeping track of your progress will help your treatment team find the right mix of medicine and therapy for you.  Write in your journal every day.  Day-to-day  changes in depression symptoms are normal. It may be more helpful to check your progress at the end of each week instead of every day.     Notes:        Depression Screen    02/12/2024    2:03 PM 11/17/2023   10:58 AM 08/18/2023   10:19 AM 05/13/2023   11:00 AM 04/15/2023   10:33 AM 02/10/2023    3:37 PM 12/31/2022   10:59 AM  PHQ 2/9 Scores  PHQ - 2 Score 3 0 4 2 0 1 2  PHQ- 9 Score 3 0 7 5  4 5     Fall Risk    02/12/2024    1:58 PM 11/17/2023   10:58 AM 08/18/2023   10:19 AM 04/15/2023   10:32 AM 12/31/2022   10:59 AM  Fall Risk   Falls in the past year? 0 1 0 0 1  Number falls in past yr: 0 0 0 0 0  Injury with Fall? 0 0 0 0 0  Risk for fall due to : No Fall Risks No Fall Risks No Fall Risks History of fall(s) No Fall Risks  Follow up Falls evaluation completed Falls evaluation completed Falls evaluation completed Falls evaluation completed;Follow up appointment Falls evaluation completed;Falls prevention discussed    MEDICARE RISK AT HOME: Medicare Risk at Home Any stairs in or around the home?: Yes If so, are there any without handrails?: Yes Home free of loose throw rugs in walkways, pet beds, electrical cords, etc?: Yes Adequate lighting in your home to reduce risk of falls?:  Yes Life alert?: No Use of a cane, walker or w/c?: No Grab bars in the bathroom?: No Shower chair or bench in shower?: Yes Elevated toilet seat or a handicapped toilet?: Yes  TIMED UP AND GO:  Was the test performed?  Yes  Length of time to ambulate 10 feet: 8 sec Gait steady and fast without use of assistive device    Cognitive Function:    02/12/2024    2:10 PM 09/01/2023   10:46 AM  MMSE - Mini Mental State Exam  Orientation to time 3 5  Orientation to Place 5 5  Registration 3 3  Attention/ Calculation 5 5  Recall 1 0  Language- name 2 objects 2 2  Language- repeat 1 1  Language- follow 3 step command 3 3  Language- read & follow direction 1 1  Write a sentence 1 1  Copy design  1 0  Total score 26 26        02/12/2024    2:06 PM 02/10/2023    3:41 PM  6CIT Screen  What Year? 0 points 0 points  What month? 0 points 0 points  What time? 0 points 0 points  Count back from 20 0 points 0 points  Months in reverse 4 points 0 points  Repeat phrase 0 points 0 points  Total Score 4 points 0 points    Immunizations Immunization History  Administered Date(s) Administered   Fluad Trivalent(High Dose 65+) 04/15/2023   PNEUMOCOCCAL CONJUGATE-20 04/15/2023   Pneumococcal Polysaccharide-23 03/04/2017   Tdap 10/04/2016    TDAP status: Up to date  Flu Vaccine status: Due, Education has been provided regarding the importance of this vaccine. Advised may receive this vaccine at local pharmacy or Health Dept. Aware to provide a copy of the vaccination record if obtained from local pharmacy or Health Dept. Verbalized acceptance and understanding.  Pneumococcal vaccine status: Up to date  Covid-19 vaccine status: Declined, Education has been provided regarding the importance of this vaccine but patient still declined. Advised may receive this vaccine at local pharmacy or Health Dept.or vaccine clinic. Aware to provide a copy of the vaccination record if obtained from local pharmacy or Health Dept. Verbalized acceptance and understanding.  Qualifies for Shingles Vaccine? Yes   Zostavax completed No   Shingrix Completed?: No.    Education has been provided regarding the importance of this vaccine. Patient has been advised to call insurance company to determine out of pocket expense if they have not yet received this vaccine. Advised may also receive vaccine at local pharmacy or Health Dept. Verbalized acceptance and understanding.  Screening Tests Health Maintenance  Topic Date Due   Zoster Vaccines- Shingrix (1 of 2) Never done   INFLUENZA VACCINE  01/30/2024   COVID-19 Vaccine (1) 03/31/2024 (Originally 07/30/1950)   Medicare Annual Wellness (AWV)  02/11/2025    DTaP/Tdap/Td (2 - Td or Tdap) 10/05/2026   Pneumococcal Vaccine: 50+ Years  Completed   DEXA SCAN  Completed   Hepatitis C Screening  Completed   HPV VACCINES  Aged Out   Meningococcal B Vaccine  Aged Out   Colonoscopy  Discontinued    Health Maintenance  Health Maintenance Due  Topic Date Due   Zoster Vaccines- Shingrix (1 of 2) Never done   INFLUENZA VACCINE  01/30/2024    Colorectal cancer screening: No longer required.   Mammogram status: No longer required due to age.  Bone Density status: Completed 09/20/2014. Results reflect: Bone density results: NORMAL. Repeat every  00 years. No longer needed.  Lung Cancer Screening: (Low Dose CT Chest recommended if Age 33-80 years, 20 pack-year currently smoking OR have quit w/in 15years.) does not qualify.    Additional Screening:  Hepatitis C Screening: does not qualify; Completed 04/15/2023  Vision Screening: Recommended annual ophthalmology exams for early detection of glaucoma and other disorders of the eye. Is the patient up to date with their annual eye exam?  Yes  Who is the provider or what is the name of the office in which the patient attends annual eye exams? [facility] If pt is not established with a provider, would they like to be referred to a provider to establish care? No .   Dental Screening: Recommended annual dental exams for proper oral hygiene  Diabetic Foot Exam: N/A  Community Resource Referral / Chronic Care Management: CRR required this visit?  No   CCM required this visit?  No     Plan:    Encounter for Medicare annual wellness exam Assessment & Plan: Up to date on screening Declines the shingles, but wants the flu vaccine.   Things to do to keep yourself healthy  - Exercise at least 30-45 minutes a day, 3-4 days a week.  - Eat a low-fat diet with lots of fruits and vegetables, up to 7-9 servings per day.  - Seatbelts can save your life. Wear them always.  - Smoke detectors on  every level of your home, check batteries every year.  - Eye Doctor - have an eye exam every 1-2 years  - Alcohol -  If you drink, do it moderately, less than 2 drinks per day.  - Health Care Power of Attorney. Choose someone to speak for you if you are not able.  - Depression is common in our stressful world.If you're feeling down or losing interest in things you normally enjoy, please come in for a visit.  - Violence - If anyone is threatening or hurting you, please call immediately.      I have personally reviewed and noted the following in the patient's chart:   Medical and social history Use of alcohol, tobacco or illicit drugs  Current medications and supplements including opioid prescriptions. Patient is not currently taking opioid prescriptions. Functional ability and status Nutritional status Physical activity Advanced directives List of other physicians Hospitalizations, surgeries, and ER visits in previous 12 months Vitals Screenings to include cognitive, depression, and falls Referrals and appointments  In addition, I have reviewed and discussed with patient certain preventive protocols, quality metrics, and best practice recommendations. A written personalized care plan for preventive services as well as general preventive health recommendations were provided to patient.    Harrie Cedar, FNP [facility] [phone]     02/12/2024   After Visit Summary: (Declined) Due to this being a telephonic visit, with patients personalized plan was offered to patient but patient Declined AVS at this time

## 2024-02-12 NOTE — Assessment & Plan Note (Signed)
 Up to date on screening Declines the shingles, but wants the flu vaccine.   Things to do to keep yourself healthy  - Exercise at least 30-45 minutes a day, 3-4 days a week.  - Eat a low-fat diet with lots of fruits and vegetables, up to 7-9 servings per day.  - Seatbelts can save your life. Wear them always.  - Smoke detectors on every level of your home, check batteries every year.  - Eye Doctor - have an eye exam every 1-2 years  - Alcohol -  If you drink, do it moderately, less than 2 drinks per day.  - Health Care Power of Attorney. Choose someone to speak for you if you are not able.  - Depression is common in our stressful world.If you're feeling down or losing interest in things you normally enjoy, please come in for a visit.  - Violence - If anyone is threatening or hurting you, please call immediately.

## 2024-02-26 ENCOUNTER — Other Ambulatory Visit: Payer: Self-pay | Admitting: Family Medicine

## 2024-02-26 DIAGNOSIS — E782 Mixed hyperlipidemia: Secondary | ICD-10-CM

## 2024-03-08 ENCOUNTER — Other Ambulatory Visit: Payer: Self-pay | Admitting: Family Medicine

## 2024-03-08 DIAGNOSIS — F4321 Adjustment disorder with depressed mood: Secondary | ICD-10-CM

## 2024-04-07 ENCOUNTER — Other Ambulatory Visit: Payer: Self-pay | Admitting: Family Medicine

## 2024-04-07 DIAGNOSIS — F4321 Adjustment disorder with depressed mood: Secondary | ICD-10-CM

## 2024-04-07 DIAGNOSIS — I10 Essential (primary) hypertension: Secondary | ICD-10-CM

## 2024-05-06 ENCOUNTER — Other Ambulatory Visit: Payer: Self-pay | Admitting: Family Medicine

## 2024-05-06 DIAGNOSIS — F4321 Adjustment disorder with depressed mood: Secondary | ICD-10-CM

## 2024-05-11 ENCOUNTER — Other Ambulatory Visit: Payer: Self-pay | Admitting: Family Medicine

## 2024-05-11 DIAGNOSIS — F324 Major depressive disorder, single episode, in partial remission: Secondary | ICD-10-CM

## 2024-05-17 ENCOUNTER — Ambulatory Visit (INDEPENDENT_AMBULATORY_CARE_PROVIDER_SITE_OTHER): Admitting: Family Medicine

## 2024-05-17 ENCOUNTER — Encounter: Payer: Self-pay | Admitting: Family Medicine

## 2024-05-17 VITALS — BP 120/80 | HR 86 | Temp 98.2°F | Resp 18 | Ht 67.0 in | Wt 153.6 lb

## 2024-05-17 DIAGNOSIS — I10 Essential (primary) hypertension: Secondary | ICD-10-CM | POA: Diagnosis not present

## 2024-05-17 DIAGNOSIS — F411 Generalized anxiety disorder: Secondary | ICD-10-CM | POA: Diagnosis not present

## 2024-05-17 DIAGNOSIS — L989 Disorder of the skin and subcutaneous tissue, unspecified: Secondary | ICD-10-CM | POA: Insufficient documentation

## 2024-05-17 DIAGNOSIS — E782 Mixed hyperlipidemia: Secondary | ICD-10-CM | POA: Diagnosis not present

## 2024-05-17 NOTE — Assessment & Plan Note (Addendum)
 Well controlled.  No changes to medicines.  Last lipids Lab Results  Component Value Date   CHOL 157 11/17/2023   HDL 63 11/17/2023   LDLCALC 80 11/17/2023   TRIG 70 11/17/2023   CHOLHDL 2.5 11/17/2023   - Continue Rosuvastatin  5 mg daily - Continue to work on eating a healthy diet and exercise.  - Labs drawn today.   Orders:   Lipid panel

## 2024-05-17 NOTE — Assessment & Plan Note (Addendum)
 Hypertension well controlled BP Readings from Last 3 Encounters:  05/17/24 120/80  02/12/24 128/80  11/17/23 (!) 152/96  - Continue taking Valsartan  320 mg once daily - labs drawn today Orders:   CBC with Differential/Platelet   [id number]

## 2024-05-17 NOTE — Progress Notes (Signed)
 Subjective:  Patient ID: Cheryl Myers, female    DOB: 09-Mar-1946  Age: 78 y.o. MRN: [id number]  Chief Complaint  Patient presents with   Medical Management of Chronic Issues   Discussed the use of AI scribe software for clinical note transcription with the patient, who gave verbal consent to proceed.  History of Present Illness   Cheryl Myers is a 78 year old female with a history of skin cancer who presents for evaluation of a new skin lesion and management of chronic conditions.  Hypertension:   - Taking Valsartan  320 mg daily. No chest pain, headaches or shortness of breath. - Follows a protein-rich diet - Denies headaches, chest pain or shortness of breath  Hyperlipidemia - Taking Crestor  5 mg once daily - Does not exercise but keeps busy everyday with activities including taking her granddaughter to dance and dinner on Thursdays. She also reports being active in the church and having something scheduled nearly everyday of the week.   GAD - Taking paroxetine  20 mg daily, abilify  2 mg daily, and xanax  0.5 mg twice daily.  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 - Long-term dermatologic care in [place] with a dermatologist who is also a surgeon      02/12/2024    2:03 PM 11/17/2023   10:58 AM 08/18/2023   10:19 AM 05/13/2023   11:00 AM 04/15/2023   10:33 AM  Depression screen PHQ 2/9  Decreased Interest 0 0 2 1 0  Down, Depressed, Hopeless 3 0 2 1 0  PHQ - 2 Score 3 0 4 2 0  Altered sleeping 0 0 1 1   Tired, decreased energy 0 0 1 0   Change in appetite 0 0 0 1   Feeling bad or failure about yourself  0 0 0 1   Trouble concentrating 0 0  0 0   Moving slowly or fidgety/restless 0 0 1 0   Suicidal thoughts 0 0 0 0   PHQ-9 Score 3  0  7  5    Difficult doing work/chores Not difficult at all Not difficult at all Somewhat difficult Very difficult Not difficult at all     Data saved with a previous flowsheet row definition        02/12/2024    1:58 PM  Fall Risk   Falls in the past year? 0  Number falls in past yr: 0  Injury with Fall? 0  Risk for fall due to : No Fall Risks  Follow up Falls evaluation completed    Patient Care Team: Teressa Harrie HERO, FNP as PCP - General (Family Medicine)   Review of Systems  Constitutional:  Negative for chills, diaphoresis, fatigue and fever.  HENT:  Negative for congestion, ear pain and sinus pain.   Eyes: Negative.   Respiratory:  Negative for cough and shortness of breath.   Cardiovascular:  Negative for chest pain.  Gastrointestinal:  Negative for abdominal pain, constipation, diarrhea, nausea and vomiting.  Endocrine: Negative.   Genitourinary:  Negative for dysuria.  Musculoskeletal:  Negative for arthralgias.  Skin: Negative.   Allergic/Immunologic: Negative.   Neurological:  Negative  for dizziness, weakness, light-headedness and headaches.  Hematological: Negative.   Psychiatric/Behavioral:  Negative for dysphoric mood. The patient is nervous/anxious.     Current Outpatient Medications on File Prior to Visit  Medication Sig Dispense Refill   ALPRAZolam  (XANAX ) 0.5 MG tablet Take 1 tablet by mouth 2 times daily as needed. for anxiety 60 tablet 0   ARIPiprazole  (ABILIFY ) 2 MG tablet Take 1 tablet (2 mg total) by mouth at bedtime as needed. 90 tablet 1   fluorouracil (EFUDEX) 5 % cream Apply 1 Application topically 2 (two) times daily.     PARoxetine  (PAXIL ) 20 MG tablet Take 1 tablet by mouth once daily. 90 tablet 0   rosuvastatin  (CRESTOR ) 5 MG tablet Take 1 tablet by mouth once daily. 90 tablet 1   valsartan  (DIOVAN ) 320 MG tablet Take 1 tablet (320 mg total) by mouth  daily. 90 tablet 0   No current facility-administered medications on file prior to visit.   Past Medical History:  Diagnosis Date   Anxiety    Cancer (HCC)    Basal and squamous cell skin cancer   Hyperlipidemia    Hypertension    Osteopenia 08/2014   T score -1.9 FRAX 11%/1.8%   Past Surgical History:  Procedure Laterality Date   Basal and squamous cell skin cancers excised      OVARIAN CYST REMOVAL      Family History  Problem Relation Age of Onset   Diabetes Mother    Alzheimer's disease Mother    Melanoma Brother    Diabetes Maternal Aunt    Melanoma Maternal Uncle    Diabetes Maternal Uncle    Heart disease Maternal Uncle    Melanoma Maternal Uncle    Heart disease Maternal Uncle    Social History   Socioeconomic History   Marital status: Married    Spouse name: Not on file   Number of children: Not on file   Years of education: Not on file   Highest education level: Not on file  Occupational History   Not on file  Tobacco Use   Smoking status: Never   Smokeless tobacco: Never  Vaping Use   Vaping status: Never Used  Substance and Sexual Activity   Alcohol use: Yes    Alcohol/week: 1.0 standard drink of alcohol    Types: 1 Glasses of wine per week    Comment: socially   Drug use: No   Sexual activity: Not Currently    Birth control/protection: Post-menopausal    Comment: 1st intercourse 4 yo--1 partner  Other Topics Concern   Not on file  Social History Narrative   Not on file   Social Drivers of Health   Financial Resource Strain: Low Risk  (02/10/2023)   Overall Financial Resource Strain (CARDIA)    Difficulty of Paying Living Expenses: Not hard at all  Food Insecurity: No Food Insecurity (02/12/2024)   Hunger Vital Sign    Worried About Running Out of Food in the Last Year: Never true    Ran Out of Food in the Last Year: Never true  Transportation Needs: No Transportation Needs (02/12/2024)   PRAPARE - Scientist, Research (physical Sciences) (Medical): No    Lack of Transportation (Non-Medical): No  Physical Activity: Insufficiently Active (02/10/2023)   Exercise Vital Sign    Days of Exercise per Week: 3 days    Minutes of Exercise per Session: 20 min  Stress: No Stress Concern Present (02/10/2023)   [employer] of  Occupational Health - Occupational Stress Questionnaire    Feeling of Stress : Not at all  Social Connections: Moderately Isolated (02/10/2023)   Social Connection and Isolation Panel    Frequency of Communication with Friends and Family: More than three times a week    Frequency of Social Gatherings with Friends and Family: More than three times a week    Attends Religious Services: More than 4 times per year    Active Member of [employer] or Organizations: No    Attends Banker Meetings: Never    Marital Status: Widowed    Objective:  BP 120/80   Pulse 86   Temp 98.2 F (36.8 C) (Temporal)   Resp 18   Ht 5' 7 (1.702 m)   Wt 153 lb 9.6 oz (69.7 kg)   SpO2 99%   BMI 24.06 kg/m      05/17/2024    1:47 PM 02/12/2024    1:46 PM 11/17/2023    2:02 PM  BP/Weight  Systolic BP 120 128 152  Diastolic BP 80 80 96  Wt. (Lbs) 153.6 150   BMI 24.06 kg/m2 23.49 kg/m2     Physical Exam Vitals reviewed.  Constitutional:      General: She is not in acute distress.    Appearance: Normal appearance. She is not ill-appearing.  Eyes:     Conjunctiva/sclera: Conjunctivae normal.  Cardiovascular:     Rate and Rhythm: Normal rate and regular rhythm.     Heart sounds: Normal heart sounds. No murmur heard. Pulmonary:     Effort: Pulmonary effort is normal. No respiratory distress.     Breath sounds: Normal breath sounds.  Abdominal:     Palpations: Abdomen is soft.  Musculoskeletal:        General: Normal range of motion.  Neurological:     Mental Status: She is alert and oriented to person, place, and time. Mental status is at baseline.  Psychiatric:        Mood and Affect: Mood  normal.        Behavior: Behavior normal.      Lab Results  Component Value Date   WBC 3.9 11/17/2023   HGB 11.4 11/17/2023   HCT 35.9 11/17/2023   PLT 252 11/17/2023   GLUCOSE 95 11/17/2023   CHOL 157 11/17/2023   TRIG 70 11/17/2023   HDL 63 11/17/2023   LDLCALC 80 11/17/2023   ALT 15 11/17/2023   AST 17 11/17/2023   NA 140 11/17/2023   K 4.6 11/17/2023   CL 104 11/17/2023   CREATININE 0.93 11/17/2023   BUN 17 11/17/2023   CO2 22 11/17/2023   TSH 2.990 08/16/2022   HGBA1C 6.1 (H) 08/18/2023    Results for orders placed or performed in visit on 11/17/23  Lipid panel   Collection Time: 11/17/23 11:41 AM  Result Value Ref Range   Cholesterol, Total 157 100 - 199 mg/dL   Triglycerides 70 0 - 149 mg/dL   HDL 63 >60 mg/dL   VLDL Cholesterol Cal 14 5 - 40 mg/dL   LDL Chol Calc (NIH) 80 0 - 99 mg/dL   Chol/HDL Ratio 2.5 0.0 - 4.4 ratio  CMP14+EGFR   Collection Time: 11/17/23 11:41 AM  Result Value Ref Range   Glucose 95 70 - 99 mg/dL   BUN 17 8 - 27 mg/dL   Creatinine, Ser 9.06 0.57 - 1.00 mg/dL   eGFR 63 >40 fO/fpw/8.26   BUN/Creatinine Ratio 18 12 - 28  Sodium 140 134 - 144 mmol/L   Potassium 4.6 3.5 - 5.2 mmol/L   Chloride 104 96 - 106 mmol/L   CO2 22 20 - 29 mmol/L   Calcium  9.4 8.7 - 10.3 mg/dL   Total Protein 6.7 6.0 - 8.5 g/dL   Albumin 4.4 3.8 - 4.8 g/dL   Globulin, Total 2.3 1.5 - 4.5 g/dL   Bilirubin Total 0.6 0.0 - 1.2 mg/dL   Alkaline Phosphatase 117 44 - 121 IU/L   AST 17 0 - 40 IU/L   ALT 15 0 - 32 IU/L  CBC with Differential/Platelet   Collection Time: 11/17/23 11:41 AM  Result Value Ref Range   WBC 3.9 3.4 - 10.8 x10E3/uL   RBC 3.87 3.77 - 5.28 x10E6/uL   Hemoglobin 11.4 11.1 - 15.9 g/dL   Hematocrit 64.0 65.9 - 46.6 %   MCV 93 79 - 97 fL   MCH 29.5 26.6 - 33.0 pg   MCHC 31.8 31.5 - 35.7 g/dL   RDW 87.2 88.2 - 84.5 %   Platelets 252 150 - 450 x10E3/uL   Neutrophils 56 Not Estab. %   Lymphs 33 Not Estab. %   Monocytes 8 Not Estab. %    Eos 2 Not Estab. %   Basos 1 Not Estab. %   Neutrophils Absolute 2.2 1.4 - 7.0 x10E3/uL   Lymphocytes Absolute 1.3 0.7 - 3.1 x10E3/uL   Monocytes Absolute 0.3 0.1 - 0.9 x10E3/uL   EOS (ABSOLUTE) 0.1 0.0 - 0.4 x10E3/uL   Basophils Absolute 0.0 0.0 - 0.2 x10E3/uL   Immature Granulocytes 0 Not Estab. %   Immature Grans (Abs) 0.0 0.0 - 0.1 x10E3/uL  .  Assessment & Plan:   Assessment & Plan Essential hypertension Hypertension well controlled BP Readings from Last 3 Encounters:  05/17/24 120/80  02/12/24 128/80  11/17/23 (!) 152/96  - Continue taking Valsartan  320 mg once daily - labs drawn today Orders:   CBC with Differential/Platelet   CMP14+EGFR   Mixed hyperlipidemia Well controlled.  No changes to medicines.  Last lipids Lab Results  Component Value Date   CHOL 157 11/17/2023   HDL 63 11/17/2023   LDLCALC 80 11/17/2023   TRIG 70 11/17/2023   CHOLHDL 2.5 11/17/2023   - Continue Rosuvastatin  5 mg daily - Continue to work on eating a healthy diet and exercise.  - Labs drawn today.   Orders:   Lipid panel   GAD (generalized anxiety disorder) Improved since last visit -Continue Xanax  as needed. -Consider strategies to manage anxiety, such as avoiding negative influences, particularly towards the end of the month when medication may run out.      Skin lesion of right arm Skin cancer, unspecified type New skin lesion with concerning features. History of squamous cell carcinomas and melanoma concerns. - Follow-up with dermatologist in [place] for evaluation and potential surgical intervention.      Orders Placed This Encounter  Procedures   CBC with Differential/Platelet   Lipid panel   CMP14+EGFR       Follow-up: Return in about 3 months (around 08/17/2024) for med check, chronic, lab visit.  An After Visit Summary was printed and given to the patient.  Harrie Cedar, FNP [facility] [phone]

## 2024-05-18 LAB — LIPID PANEL
Chol/HDL Ratio: 2.5 ratio (ref 0.0–4.4)
Cholesterol, Total: 152 mg/dL (ref 100–199)
HDL: 62 mg/dL (ref >39)
LDL Chol Calc (NIH): 71 mg/dL (ref 0–99)
Triglycerides: 107 mg/dL (ref 0–149)
VLDL Cholesterol Cal: 19 mg/dL (ref 5–40)

## 2024-05-18 LAB — CBC WITH DIFFERENTIAL/PLATELET
Basophils Absolute: 0 x10E3/uL (ref 0.0–0.2)
Basos: 0 %
EOS (ABSOLUTE): 0.1 x10E3/uL (ref 0.0–0.4)
Eos: 2 %
Hematocrit: 36.2 % (ref 34.0–46.6)
Hemoglobin: 11.7 g/dL (ref 11.1–15.9)
Immature Grans (Abs): 0 x10E3/uL (ref 0.0–0.1)
Immature Granulocytes: 0 %
Lymphocytes Absolute: 1.5 x10E3/uL (ref 0.7–3.1)
Lymphs: 31 %
MCH: 30.4 pg (ref 26.6–33.0)
MCHC: 32.3 g/dL (ref 31.5–35.7)
MCV: 94 fL (ref 79–97)
Monocytes Absolute: 0.4 x10E3/uL (ref 0.1–0.9)
Monocytes: 9 %
Neutrophils Absolute: 2.8 x10E3/uL (ref 1.4–7.0)
Neutrophils: 58 %
Platelets: 241 x10E3/uL (ref 150–450)
RBC: 3.85 x10E6/uL (ref 3.77–5.28)
RDW: 12.5 % (ref 11.7–15.4)
WBC: 4.9 x10E3/uL (ref 3.4–10.8)

## 2024-05-18 LAB — CMP14+EGFR
ALT: 16 IU/L (ref 0–32)
AST: 19 IU/L (ref 0–40)
Albumin: 4.3 g/dL (ref 3.8–4.8)
Alkaline Phosphatase: 86 IU/L (ref 49–135)
BUN/Creatinine Ratio: 17 (ref 12–28)
BUN: 18 mg/dL (ref 8–27)
Bilirubin Total: 0.8 mg/dL (ref 0.0–1.2)
CO2: 23 mmol/L (ref 20–29)
Calcium: 10.1 mg/dL (ref 8.7–10.3)
Chloride: 101 mmol/L (ref 96–106)
Creatinine, Ser: 1.03 mg/dL — ABNORMAL HIGH (ref 0.57–1.00)
Globulin, Total: 2.3 g/dL (ref 1.5–4.5)
Glucose: 95 mg/dL (ref 70–99)
Potassium: 4.9 mmol/L (ref 3.5–5.2)
Sodium: 137 mmol/L (ref 134–144)
Total Protein: 6.6 g/dL (ref 6.0–8.5)
eGFR: 56 mL/min/1.73 — ABNORMAL LOW (ref >59)

## 2024-05-18 NOTE — Assessment & Plan Note (Signed)
 Improved since last visit -Continue Xanax  as needed. -Consider strategies to manage anxiety, such as avoiding negative influences, particularly towards the end of the month when medication may run out.

## 2024-05-18 NOTE — Assessment & Plan Note (Signed)
 Skin cancer, unspecified type New skin lesion with concerning features. History of squamous cell carcinomas and melanoma concerns. - Follow-up with dermatologist in [place] for evaluation and potential surgical intervention.

## 2024-05-19 ENCOUNTER — Ambulatory Visit: Payer: Self-pay | Admitting: Family Medicine

## 2024-06-08 ENCOUNTER — Other Ambulatory Visit: Payer: Self-pay | Admitting: Family Medicine

## 2024-06-08 DIAGNOSIS — F4321 Adjustment disorder with depressed mood: Secondary | ICD-10-CM

## 2024-06-28 ENCOUNTER — Other Ambulatory Visit: Payer: Self-pay | Admitting: Family Medicine

## 2024-06-28 DIAGNOSIS — F4321 Adjustment disorder with depressed mood: Secondary | ICD-10-CM

## 2024-07-06 ENCOUNTER — Other Ambulatory Visit: Payer: Self-pay | Admitting: Family Medicine

## 2024-07-06 ENCOUNTER — Ambulatory Visit: Payer: Self-pay

## 2024-07-06 DIAGNOSIS — F4321 Adjustment disorder with depressed mood: Secondary | ICD-10-CM

## 2024-07-06 DIAGNOSIS — I10 Essential (primary) hypertension: Secondary | ICD-10-CM

## 2024-07-06 MED ORDER — ALPRAZOLAM 0.5 MG PO TABS: 0.5000 mg | ORAL_TABLET | Freq: Two times a day (BID) | ORAL | 0 refills | Status: DC | PRN

## 2024-07-06 NOTE — Telephone Encounter (Signed)
 Copied from CRM [id number]. Topic: Clinical - Medication Refill >> Jul 06, 2024  1:38 PM Everette C wrote: Medication: ALPRAZolam  (XANAX ) 0.5 MG tablet [[id number]]  Has the patient contacted their pharmacy? Yes (Agent: If no, request that the patient contact the pharmacy for the refill. If patient does not wish to contact the pharmacy document the reason why and proceed with request.) (Agent: If yes, when and what did the pharmacy advise?)  This is the patient's preferred pharmacy:  [address] - [place], [place] - [address] [place] [address] Phone: [phone] Fax: [phone]  Is this the correct pharmacy for this prescription? Yes If no, delete pharmacy and type the correct one.   Has the prescription been filled recently? Yes  Is the patient out of the medication? Yes  Has the patient been seen for an appointment in the last year OR does the patient have an upcoming appointment? Yes  Can we respond through MyChart? No  Agent: Please be advised that Rx refills may take up to 3 business days. We ask that you follow-up with your pharmacy.

## 2024-07-06 NOTE — Telephone Encounter (Signed)
" °  FYI Only or Action Required?: FYI only for provider: appointment scheduled on 07/07/24.  Patient was last seen in primary care on 05/17/2024 by Teressa Harrie HERO, FNP.  Called Nurse Triage reporting Wound Infection.  Symptoms began today.  Interventions attempted: Nothing.  Symptoms are: gradually worsening.  Triage Disposition: See Physician Within 24 Hours  Patient/caregiver understands and will follow disposition?: Yes  Patient called dermatology office that preformed procedure and was told nothing was available if she was concerned go to ED.  Copied from CRM [id number]. Topic: Clinical - Red Word Triage >> Jul 06, 2024  1:23 PM Berneda FALCON wrote: Red Word that prompted transfer to Nurse Triage: Daughter in law, Jodie, calling for patient stating that there is a spot on the patient's arm that is Oozing milky substance. No bleeding, but there is concern for infection. Patient recently had a procedure done at dermatologist office and it was at this site. Stated they called dermatologist who told them they could not be seen this week, but if it got worse to go to the ED. Daughter wants patient to be seen in the office by PCP this week if possible. Reason for Disposition  [1] INCREASING pain in incision AND [2] > 2 days (48 hours) since surgery  Answer Assessment - Initial Assessment Questions 1. SYMPTOM: What's the main symptom you're concerned about? (e.g., drainage, incision opened up, pain, redness)     Pus  2. ONSET: When did drainage  start?     Pt states cloudy drainage since procedure 3. SURGERY: What surgery did you have?     [place] cell surgery removed a week ago.  4. DATE of SURGERY: When was the surgery?      A week ago 5. INCISION SITE: Where is the incision located?      arm 6. REDNESS: Is there any redness at the incision site? If Yes, ask: How wide across is the redness? (Inches, centimeters)      Small amt redness surrounding area, denies spreading 7.  PAIN: Is there any pain? If Yes, ask: How bad is it?  (Scale 0-10; or none, mild, moderate, severe)     moderate 8. BLEEDING: Is there any bleeding? If Yes, ask: How much? and Where?     No  9. DRAINAGE: Is there any drainage from the incision site? If Yes, ask: What color and how much? (e.g., red, cloudy, pus; drops, teaspoon)     White, thin fluid  10. FEVER: Do you have a fever? If Yes, ask: What is your temperature, how was it measured, and when did it start?       denies 11. OTHER SYMPTOMS: Do you have any other symptoms? (e.g., dizziness, rash elsewhere on body, shaking chills, weakness)       no  Protocols used: Post-Op Incision Symptoms and Questions-A-AH  "

## 2024-07-07 ENCOUNTER — Ambulatory Visit: Admitting: Family Medicine

## 2024-07-07 ENCOUNTER — Encounter: Payer: Self-pay | Admitting: Family Medicine

## 2024-07-07 VITALS — BP 158/80 | HR 67 | Temp 97.9°F | Resp 16 | Ht 67.0 in | Wt 158.1 lb

## 2024-07-07 DIAGNOSIS — L089 Local infection of the skin and subcutaneous tissue, unspecified: Secondary | ICD-10-CM

## 2024-07-07 DIAGNOSIS — F19982 Other psychoactive substance use, unspecified with psychoactive substance-induced sleep disorder: Secondary | ICD-10-CM | POA: Diagnosis not present

## 2024-07-07 DIAGNOSIS — I10 Essential (primary) hypertension: Secondary | ICD-10-CM | POA: Diagnosis not present

## 2024-07-07 DIAGNOSIS — Z23 Encounter for immunization: Secondary | ICD-10-CM

## 2024-07-07 MED ORDER — MUPIROCIN 2 % EX OINT: 1.0000 | TOPICAL_OINTMENT | Freq: Two times a day (BID) | CUTANEOUS | 0 refills | Status: AC

## 2024-07-07 MED ORDER — CEPHALEXIN 500 MG PO CAPS: 500.0000 mg | ORAL_CAPSULE | Freq: Two times a day (BID) | ORAL | 0 refills | Status: AC

## 2024-07-07 NOTE — Assessment & Plan Note (Signed)
 Skin infection Post-procedural infection with redness, oozing, and tenderness. No fever. Concerns about wound healing and potential need for further intervention. - Prescribed Keflex  500 mg orally twice a day for 7 days. - Prescribed topical antibiotic ointment. - Advised against using peroxide on the wound. - Instructed to follow up with dermatologist in [place]. Orders:   cephALEXin  (KEFLEX ) 500 MG capsule; Take 1 capsule (500 mg total) by mouth 2 (two) times daily for 7 days.   mupirocin  ointment (BACTROBAN ) 2 %; Apply 1 Application topically 2 (two) times daily.

## 2024-07-07 NOTE — Patient Instructions (Signed)
" °  VISIT SUMMARY: During today's visit, we addressed several health concerns including an infected surgical site, emotional distress due to discontinuation of Xanax , and elevated blood pressure. We also discussed general health maintenance, including the flu vaccine.  YOUR PLAN: -SKIN INFECTION: You have an infection at the site of your recent skin cancer excision, which is causing redness, oozing, and tenderness. We have prescribed Keflex  500 mg to be taken orally twice a day for 7 days and a topical antibiotic ointment. Please stop using peroxide on the wound and follow up with your dermatologist in [place].  -ESSENTIAL HYPERTENSION: Your blood pressure is higher than usual, possibly due to stress. We have given you a blood pressure log to monitor your readings at home and scheduled a follow-up appointment in one week. If your blood pressure remains elevated, we may adjust your medication.  -DRUG-INDUCED INSOMNIA: Your difficulty sleeping and increased anxiety are likely due to not taking Xanax . Please ensure your Xanax  prescription is filled and taken as prescribed.  -GENERAL HEALTH MAINTENANCE: We discussed and agreed upon getting a flu vaccine. Your COVID vaccine is up to date.  INSTRUCTIONS: Please follow up with your dermatologist in [place] for the infected surgical site. Monitor your blood pressure at home and return for a follow-up appointment in one week. Ensure your Xanax  prescription is filled and taken as prescribed. You will receive your flu shot today before leaving the office.                      Contains text generated by [employer].                                 Contains text generated by [employer].   "

## 2024-07-07 NOTE — Assessment & Plan Note (Signed)
 Essential hypertension Blood pressure elevated at 158/80 mmHg, previously well-controlled. Possible stress-related. No symptoms reported. Family history of strokes. BP Readings from Last 3 Encounters:  07/07/24 (!) 158/80  05/17/24 120/80  02/12/24 128/80  - Provided blood pressure log for home monitoring. - Scheduled follow-up appointment in one week for blood pressure check. - Will consider medication adjustment if blood pressure remains elevated.

## 2024-07-07 NOTE — Assessment & Plan Note (Signed)
 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 Reports difficulty sleeping and increased anxiety.    07/07/2024   10:27 AM 11/17/2023   10:58 AM 08/18/2023   10:20 AM 05/13/2023   11:01 AM  GAD 7 : Generalized Anxiety Score  Nervous, Anxious, on Edge 3 2 3 1   Control/stop worrying 3 2 3 2   Worry too much - different things 3 2 3 2   Trouble relaxing 3 2 2 2   Restless 3 2 2 1   Easily annoyed or irritable 1 2 2  0  Afraid - awful might happen 1 0 2 1  Total GAD 7 Score 17 12 17 9   Anxiety Difficulty Not difficult at all Not difficult at all Somewhat difficult   - Discussed effect of stress and anxiety on blood pressure, encouraged deep breathing exercises - Ensure Xanax  prescription is filled and taken as prescribed.

## 2024-07-07 NOTE — Progress Notes (Signed)
 "  Acute Office Visit  Subjective:    Patient ID: Cheryl Myers, female    DOB: Dec 21, 1945, 79 y.o.   MRN: [id number]  Chief Complaint  Patient presents with   Wound Infection    Discussed the use of AI scribe software for clinical note transcription with the patient, who gave verbal consent to proceed.  History of Present Illness   Cheryl Myers is a 79 year old female who presents with an infected surgical site following a recent excision.  Surgical site infection - Underwent skin cancer excision approximately two weeks ago - Persistent redness at the surgical site since the procedure - Oozing from the site began about one week ago - Worsening redness and streaking over the past few days - Site is tender to touch - No fever, headache, blurred vision, or swelling in the legs - Has been applying peroxide to the area - History of approximately twenty-five prior skin cancer removals without similar complications - Recent procedure performed locally rather than with usual dermatologist in [place]  Emotional distress and benzodiazepine discontinuation - Increased emotional distress, including crying - Attributes symptoms to not having Xanax  for about a week - Usually takes Xanax  twice daily but ran out of prescription - Current psychiatric medications include Abilify  and Paxil , managed by family members  Hypertension management - History of hypertension - Takes blood pressure medication daily, organized in a weekly pill pack by family  Functional status and social concerns - Actively involved in granddaughter's activities and family support - Expresses concern about ability to continue these activities due to current health issues  Family history of cerebrovascular disease - Family history of stroke; grandfather died from stroke at a young age      Past Medical History:  Diagnosis Date   Anxiety    Cancer (HCC)    Basal and squamous cell skin cancer   Hyperlipidemia     Hypertension    Osteopenia 08/2014   T score -1.9 FRAX 11%/1.8%    Past Surgical History:  Procedure Laterality Date   Basal and squamous cell skin cancers excised      OVARIAN CYST REMOVAL      Family History  Problem Relation Age of Onset   Diabetes Mother    Alzheimer's disease Mother    Melanoma Brother    Diabetes Maternal Aunt    Melanoma Maternal Uncle    Diabetes Maternal Uncle    Heart disease Maternal Uncle    Melanoma Maternal Uncle    Heart disease Maternal Uncle     Social History   Socioeconomic History   Marital status: Married    Spouse name: Not on file   Number of children: Not on file   Years of education: Not on file   Highest education level: Not on file  Occupational History   Not on file  Tobacco Use   Smoking status: Never   Smokeless tobacco: Never  Vaping Use   Vaping status: Never Used  Substance and Sexual Activity   Alcohol use: Yes    Alcohol/week: 1.0 standard drink of alcohol    Types: 1 Glasses of wine per week    Comment: socially   Drug use: No   Sexual activity: Not Currently    Birth control/protection: Post-menopausal    Comment: 1st intercourse 37 yo--1 partner  Other Topics Concern   Not on file  Social History Narrative   Not on file   Social Drivers of Health   Tobacco  Use: Low Risk (07/07/2024)   Patient History    Smoking Tobacco Use: Never    Smokeless Tobacco Use: Never    Passive Exposure: Not on file  Financial Resource Strain: Low Risk (02/10/2023)   Overall Financial Resource Strain (CARDIA)    Difficulty of Paying Living Expenses: Not hard at all  Food Insecurity: No Food Insecurity (02/12/2024)   Epic    Worried About Programme Researcher, Broadcasting/film/video in the Last Year: Never true    Ran Out of Food in the Last Year: Never true  Transportation Needs: No Transportation Needs (02/12/2024)   Epic    Lack of Transportation (Medical): No    Lack of Transportation (Non-Medical): No  Physical Activity: Insufficiently  Active (02/10/2023)   Exercise Vital Sign    Days of Exercise per Week: 3 days    Minutes of Exercise per Session: 20 min  Stress: No Stress Concern Present (02/10/2023)   [employer] of Occupational Health - Occupational Stress Questionnaire    Feeling of Stress : Not at all  Social Connections: Moderately Isolated (02/10/2023)   Social Connection and Isolation Panel    Frequency of Communication with Friends and Family: More than three times a week    Frequency of Social Gatherings with Friends and Family: More than three times a week    Attends Religious Services: More than 4 times per year    Active Member of [employer] or Organizations: No    Attends Banker Meetings: Never    Marital Status: Widowed  Intimate Partner Violence: Not At Risk (02/12/2024)   Epic    Fear of Current or Ex-Partner: No    Emotionally Abused: No    Physically Abused: No    Sexually Abused: No  Depression (PHQ2-9): High Risk (07/07/2024)   Depression (PHQ2-9)    PHQ-2 Score: 11  Alcohol Screen: Low Risk (02/10/2023)   Alcohol Screen    Last Alcohol Screening Score (AUDIT): 2  Housing: Low Risk (02/12/2024)   Epic    Unable to Pay for Housing in the Last Year: No    Number of Times Moved in the Last Year: 0    Homeless in the Last Year: No  Utilities: Not At Risk (02/12/2024)   Epic    Threatened with loss of utilities: No  Health Literacy: Adequate Health Literacy (02/10/2023)   B1300 Health Literacy    Frequency of need for help with medical instructions: Never    Outpatient Medications Prior to Visit  Medication Sig Dispense Refill   ALPRAZolam  (XANAX ) 0.5 MG tablet Take 1 tablet (0.5 mg total) by mouth 2 (two) times daily as needed for anxiety. 60 tablet 0   ARIPiprazole  (ABILIFY ) 2 MG tablet Take 1 tablet (2 mg total) by mouth at bedtime as needed. 90 tablet 1   fluorouracil (EFUDEX) 5 % cream Apply 1 Application topically 2 (two) times daily.     PARoxetine  (PAXIL ) 20 MG tablet Take  1 tablet by mouth once daily. 90 tablet 0   rosuvastatin  (CRESTOR ) 5 MG tablet Take 1 tablet by mouth once daily. 90 tablet 1   valsartan  (DIOVAN ) 320 MG tablet Take 1 tablet (320 mg total) by mouth daily. 90 tablet 0   No facility-administered medications prior to visit.    Allergies[1]  Review of Systems  Constitutional:  Negative for chills, diaphoresis, fatigue and fever.  HENT:  Negative for congestion, ear pain and sinus pain.   Respiratory:  Negative for cough and shortness of breath.  Cardiovascular:  Negative for chest pain.  Gastrointestinal:  Negative for abdominal pain, constipation, nausea and vomiting.  Genitourinary:  Negative for dysuria.  Musculoskeletal:  Negative for arthralgias.  Skin:  Positive for wound (skin cancer removal with sutures).  Neurological:  Negative for weakness and headaches.  Psychiatric/Behavioral:  Positive for sleep disturbance. Negative for dysphoric mood. The patient is nervous/anxious.        Objective:        07/07/2024   10:36 AM 07/07/2024   10:06 AM 05/17/2024    1:47 PM  Vitals with BMI  Height  5' 7 5' 7  Weight  158 lbs 2 oz 153 lbs 10 oz  BMI  24.76 24.05  Systolic 158 182 879  Diastolic 80 86 80  Pulse  67 86    Orthostatic VS for the past 72 hrs (Last 3 readings):  Patient Position BP Location Cuff Size  07/07/24 1036 Sitting Left Arm Large     Physical Exam Vitals reviewed.  Constitutional:      General: She is not in acute distress.    Appearance: Normal appearance. She is not ill-appearing.  Cardiovascular:     Rate and Rhythm: Normal rate and regular rhythm.     Heart sounds: Normal heart sounds.  Pulmonary:     Effort: Pulmonary effort is normal.     Breath sounds: Normal breath sounds.  Abdominal:     General: Bowel sounds are normal.     Palpations: Abdomen is soft.     Tenderness: There is no abdominal tenderness.  Skin:    Findings: Erythema and wound present.  Neurological:     Mental Status:  She is alert. Mental status is at baseline.  Psychiatric:        Mood and Affect: Mood normal.        Behavior: Behavior normal.     Health Maintenance Due  Topic Date Due   Zoster Vaccines- Shingrix (1 of 2) Never done   COVID-19 Vaccine (1 - 2025-26 season) Never done    There are no preventive care reminders to display for this patient.   Lab Results  Component Value Date   TSH 2.990 08/16/2022   Lab Results  Component Value Date   WBC 4.9 05/17/2024   HGB 11.7 05/17/2024   HCT 36.2 05/17/2024   MCV 94 05/17/2024   PLT 241 05/17/2024   Lab Results  Component Value Date   NA 137 05/17/2024   K 4.9 05/17/2024   CO2 23 05/17/2024   GLUCOSE 95 05/17/2024   BUN 18 05/17/2024   CREATININE 1.03 (H) 05/17/2024   BILITOT 0.8 05/17/2024   ALKPHOS 86 05/17/2024   AST 19 05/17/2024   ALT 16 05/17/2024   PROT 6.6 05/17/2024   ALBUMIN 4.3 05/17/2024   CALCIUM  10.1 05/17/2024   ANIONGAP 9 01/18/2018   EGFR 56 (L) 05/17/2024   GFR 73.21 10/04/2016   Lab Results  Component Value Date   CHOL 152 05/17/2024   Lab Results  Component Value Date   HDL 62 05/17/2024   Lab Results  Component Value Date   LDLCALC 71 05/17/2024   Lab Results  Component Value Date   TRIG 107 05/17/2024   Lab Results  Component Value Date   CHOLHDL 2.5 05/17/2024   Lab Results  Component Value Date   HGBA1C 6.1 (H) 08/18/2023        Results for orders placed or performed in visit on 05/17/24  CBC with Differential/Platelet  Collection Time: 05/17/24  3:02 PM  Result Value Ref Range   WBC 4.9 3.4 - 10.8 x10E3/uL   RBC 3.85 3.77 - 5.28 x10E6/uL   Hemoglobin 11.7 11.1 - 15.9 g/dL   Hematocrit 63.7 65.9 - 46.6 %   MCV 94 79 - 97 fL   MCH 30.4 26.6 - 33.0 pg   MCHC 32.3 31.5 - 35.7 g/dL   RDW 87.4 88.2 - 84.5 %   Platelets 241 150 - 450 x10E3/uL   Neutrophils 58 Not Estab. %   Lymphs 31 Not Estab. %   Monocytes 9 Not Estab. %   Eos 2 Not Estab. %   Basos 0 Not Estab. %    Neutrophils Absolute 2.8 1.4 - 7.0 x10E3/uL   Lymphocytes Absolute 1.5 0.7 - 3.1 x10E3/uL   Monocytes Absolute 0.4 0.1 - 0.9 x10E3/uL   EOS (ABSOLUTE) 0.1 0.0 - 0.4 x10E3/uL   Basophils Absolute 0.0 0.0 - 0.2 x10E3/uL   Immature Granulocytes 0 Not Estab. %   Immature Grans (Abs) 0.0 0.0 - 0.1 x10E3/uL  Lipid panel   Collection Time: 05/17/24  3:02 PM  Result Value Ref Range   Cholesterol, Total 152 100 - 199 mg/dL   Triglycerides 892 0 - 149 mg/dL   HDL 62 >60 mg/dL   VLDL Cholesterol Cal 19 5 - 40 mg/dL   LDL Chol Calc (NIH) 71 0 - 99 mg/dL   Chol/HDL Ratio 2.5 0.0 - 4.4 ratio  CMP14+EGFR   Collection Time: 05/17/24  3:02 PM  Result Value Ref Range   Glucose 95 70 - 99 mg/dL   BUN 18 8 - 27 mg/dL   Creatinine, Ser 8.96 (H) 0.57 - 1.00 mg/dL   eGFR 56 (L) >40 fO/fpw/8.26   BUN/Creatinine Ratio 17 12 - 28   Sodium 137 134 - 144 mmol/L   Potassium 4.9 3.5 - 5.2 mmol/L   Chloride 101 96 - 106 mmol/L   CO2 23 20 - 29 mmol/L   Calcium  10.1 8.7 - 10.3 mg/dL   Total Protein 6.6 6.0 - 8.5 g/dL   Albumin 4.3 3.8 - 4.8 g/dL   Globulin, Total 2.3 1.5 - 4.5 g/dL   Bilirubin Total 0.8 0.0 - 1.2 mg/dL   Alkaline Phosphatase 86 49 - 135 IU/L   AST 19 0 - 40 IU/L   ALT 16 0 - 32 IU/L     Assessment & Plan:   Assessment & Plan Skin infection Skin infection Post-procedural infection with redness, oozing, and tenderness. No fever. Concerns about wound healing and potential need for further intervention. - Prescribed Keflex  500 mg orally twice a day for 7 days. - Prescribed topical antibiotic ointment. - Advised against using peroxide on the wound. - Instructed to follow up with dermatologist in [place]. Orders:   cephALEXin  (KEFLEX ) 500 MG capsule; Take 1 capsule (500 mg total) by mouth 2 (two) times daily for 7 days.   mupirocin  ointment (BACTROBAN ) 2 %; Apply 1 Application topically 2 (two) times daily.  Drug-induced insomnia (HCC) Drug-induced insomnia Insomnia exacerbated by  discontinuation of Xanax . Reports difficulty sleeping and increased anxiety.    07/07/2024   10:27 AM 11/17/2023   10:58 AM 08/18/2023   10:20 AM 05/13/2023   11:01 AM  GAD 7 : Generalized Anxiety Score  Nervous, Anxious, on Edge 3 2 3 1   Control/stop worrying 3 2 3 2   Worry too much - different things 3 2 3 2   Trouble relaxing 3 2 2 2   Restless 3  2 2 1   Easily annoyed or irritable 1 2 2  0  Afraid - awful might happen 1 0 2 1  Total GAD 7 Score 17 12 17 9   Anxiety Difficulty Not difficult at all Not difficult at all Somewhat difficult   - Discussed effect of stress and anxiety on blood pressure, encouraged deep breathing exercises - Ensure Xanax  prescription is filled and taken as prescribed.    Essential hypertension Essential hypertension Blood pressure elevated at 158/80 mmHg, previously well-controlled. Possible stress-related. No symptoms reported. Family history of strokes. BP Readings from Last 3 Encounters:  07/07/24 (!) 158/80  05/17/24 120/80  02/12/24 128/80  - Provided blood pressure log for home monitoring. - Scheduled follow-up appointment in one week for blood pressure check. - Will consider medication adjustment if blood pressure remains elevated.    Encounter for immunization General Health Maintenance Flu vaccine discussed and agreed upon. COVID vaccine up to date. Orders:   Flu vaccine HIGH DOSE PF(Fluzone Trivalent)     Meds ordered this encounter  Medications   cephALEXin  (KEFLEX ) 500 MG capsule    Sig: Take 1 capsule (500 mg total) by mouth 2 (two) times daily for 7 days.    Dispense:  14 capsule    Refill:  0   mupirocin  ointment (BACTROBAN ) 2 %    Sig: Apply 1 Application topically 2 (two) times daily.    Dispense:  22 g    Refill:  0    Orders Placed This Encounter  Procedures   Flu vaccine HIGH DOSE PF(Fluzone Trivalent)     Follow-up: Return in about 1 week (around 07/14/2024) for BP recheck, nurse visit.  An After Visit Summary was  printed and given to the patient.  Harrie Cedar, FNP [facility] [phone]     [1] No Known Allergies  "

## 2024-07-08 NOTE — Addendum Note (Signed)
 Addended by: TERESSA HARRIE TERESSA CHRISTELLA on: 07/08/2024 04:10 PM   Modules accepted: Level of Service

## 2024-07-14 ENCOUNTER — Ambulatory Visit (INDEPENDENT_AMBULATORY_CARE_PROVIDER_SITE_OTHER)

## 2024-07-14 DIAGNOSIS — I10 Essential (primary) hypertension: Secondary | ICD-10-CM

## 2024-07-14 NOTE — Progress Notes (Signed)
 Patient is in office today for a nurse visit for Blood Pressure Check. Patient blood pressure was 120/80, Patient No chest pain, No shortness of breath, No dyspnea on exertion, No orthopnea, No paroxysmal nocturnal dyspnea, No edema, No palpitations, No syncope.

## 2024-08-02 ENCOUNTER — Other Ambulatory Visit: Payer: Self-pay | Admitting: Family Medicine

## 2024-08-02 DIAGNOSIS — F324 Major depressive disorder, single episode, in partial remission: Secondary | ICD-10-CM

## 2024-08-05 ENCOUNTER — Other Ambulatory Visit: Payer: Self-pay | Admitting: Family Medicine

## 2024-08-05 DIAGNOSIS — F4321 Adjustment disorder with depressed mood: Secondary | ICD-10-CM

## 2024-09-01 ENCOUNTER — Ambulatory Visit: Admitting: Family Medicine
